# Patient Record
Sex: Female | Born: 1985 | Race: White | Hispanic: Yes | Marital: Married | Smoking: Never smoker
Health system: Southern US, Community
[De-identification: ages and names within clinical notes are randomized; demographics above are authoritative.]

## PROBLEM LIST (undated history)

## (undated) DIAGNOSIS — R768 Other specified abnormal immunological findings in serum: Secondary | ICD-10-CM

## (undated) DIAGNOSIS — Z789 Other specified health status: Secondary | ICD-10-CM

## (undated) DIAGNOSIS — H5213 Myopia, bilateral: Secondary | ICD-10-CM

## (undated) DIAGNOSIS — H52209 Unspecified astigmatism, unspecified eye: Secondary | ICD-10-CM

## (undated) HISTORY — DX: Unspecified astigmatism, unspecified eye: H52.209

## (undated) HISTORY — DX: Myopia, bilateral: H52.13

---

## 2008-04-15 ENCOUNTER — Inpatient Hospital Stay (HOSPITAL_COMMUNITY): Admission: AD | Admit: 2008-04-15 | Discharge: 2008-04-15 | Payer: Self-pay | Admitting: Obstetrics

## 2008-04-16 ENCOUNTER — Inpatient Hospital Stay (HOSPITAL_COMMUNITY): Admission: AD | Admit: 2008-04-16 | Discharge: 2008-04-19 | Payer: Self-pay | Admitting: Obstetrics

## 2009-10-07 ENCOUNTER — Ambulatory Visit: Payer: Self-pay | Admitting: Family Medicine

## 2010-01-15 ENCOUNTER — Emergency Department (HOSPITAL_COMMUNITY): Admission: EM | Admit: 2010-01-15 | Discharge: 2010-01-15 | Payer: Self-pay | Admitting: Family Medicine

## 2010-05-02 ENCOUNTER — Emergency Department (HOSPITAL_COMMUNITY)
Admission: EM | Admit: 2010-05-02 | Discharge: 2010-05-02 | Payer: Self-pay | Source: Home / Self Care | Admitting: Family Medicine

## 2010-06-11 NOTE — L&D Delivery Note (Signed)
Operative Delivery Note  Ms Whitney [name] was prepared for a Vacuum-assisted delivery.  The indication for the procedure is maternal exhaustion  EFW 3300  grams. The cervix was completely dilated and effaced.  The bladder was emptied.  At  a viable female was delivered via .  Presentation: vertex; Position: Right,, Occiput,, Anterior; Station: +3.  Verbal consent: obtained from patient.  A time-out was performed.  Type of vacuum: M/posterior cup Total time of vacuum application 4 minutes 1st application to delivery Maximum vacuum achieved 50 (cm Hg) Number of contractions used through 2 Number of "pop offs" 0 Vacuum reduced between contractions.  There was advancement in station with each pull.  Extraction successful.  The vacuum application site was flexing median.  The flexion point was identified.  The cup choice was appropriate.  Maternal tissue was excluded from the vacuum cup.     Placenta status: delivered w/cord traction, intact 3VC    Anesthesia:  Epidural Episiotomy: None Lacerations: Anal verge/3rd degree Suture Repair: 2.0 3.0 vicryl rapide Est. Blood Loss (mL): 300  Mom to postpartum.  Infant  stable.  [name],Whitney Arias A 02/06/2011, 5:59 AM

## 2010-08-22 LAB — POCT URINALYSIS DIPSTICK
Ketones, ur: NEGATIVE mg/dL
Protein, ur: NEGATIVE mg/dL
Specific Gravity, Urine: 1.01 (ref 1.005–1.030)
pH: 6.5 (ref 5.0–8.0)

## 2010-08-22 LAB — URINE CULTURE: Colony Count: >=100000

## 2010-08-22 LAB — HCG, SERUM, QUALITATIVE: Preg, Serum: NEGATIVE

## 2010-10-24 NOTE — H&P (Signed)
NAMEVARETTA, CHAVERS NO.:  [id number]   MEDICAL RECORD NO.:  [id number]          PATIENT TYPE:  INP   LOCATION:  9103                          FACILITY:  [facility]   PHYSICIAN:  Kathreen Cosier, M.D.DATE OF BIRTH:  09-02-1985   DATE OF ADMISSION:  04/16/2008  DATE OF DISCHARGE:                              HISTORY & PHYSICAL   The patient is a 25 year old gravida 1 whose EDC was April 15, 2008.  She was admitted with ruptured membranes, which occurred at 4:00 a.m.  today, meconium-stained fluid and she was in labor and 5 cm dilated,  100% vertex, -2 at 7:45 a.m.  IUPC was inserted at that time and the  patient progressed to full dilatation by 12:25 p.m. and then she was  allowed to labor down and started pushing at 2 o'clock.  By 4:10 p.m.,  she had pushed over 2 hours and there was no descent beyond 0 station  and it decided she would be delivered by C-section for failure descent.   PHYSICAL EXAMINATION:  GENERAL:  A well-developed female in labor.  HEENT: Negative.  LUNGS: Clear.  HEART:  Regular rhythm.  No murmurs and no gallops.  BREASTS:  No masses.  ABDOMEN:  Term size uterus with estimated fetal weight 7 pounds 6  ounces.  EXTREMITIES:  Negative.           ______________________________  Kathreen Cosier, M.D.     BAM/MEDQ  D:  04/16/2008  T:  04/17/2008  Job:  [id number]

## 2010-10-24 NOTE — Op Note (Signed)
Whitney [name], MCBREARTY NO.:  [id number]   MEDICAL RECORD NO.:  [id number]          PATIENT TYPE:  INP   LOCATION:  9103                          FACILITY:  [facility]   PHYSICIAN:  Kathreen Cosier, M.D.DATE OF BIRTH:  1985-11-25   DATE OF PROCEDURE:  04/16/2008  DATE OF DISCHARGE:                               OPERATIVE REPORT   PREOPERATIVE DIAGNOSIS:  Failure to descend fetal macrosomia.   POSTOPERATIVE DIAGNOSIS:  Failure to descend fetal macrosomia.   ANESTHESIA:  Epidural.   PROCEDURE:  The patient placed on the operating table in a supine  position.  Abdomen prepped and draped.  Bladder emptied with a Foley  catheter.  Transverse suprapubic incision made and carried down through  rectus fascia.  Fascia cleaned and incised the length of the incision.  The recti muscles were retracted laterally.  Peritoneum incised  longitudinally.  Transverse incision made in the visceral peritoneum  above the bladder and the bladder mobilized inferiorly.  Transverse  lower uterine incision made.  The fluid was thin meconium and the baby  was delivered from the OP position of a female Apgar 9 and 9, weighing 8  pounds 4 ounces.  The team was in attendance.  The placenta was anterior  to fundus removed manually and sent to labor and delivery.  Uterine  cavity cleaned with dry laps.  Uterine incision closed in one layer with  continuous suture of #1 chromic.  Hemostasis satisfactory.  Bladder flap  reattached with 2-0 chromic.  Uterus well contracted.  Tubes and ovaries  normal.  Abdomen closed in layers, peritoneum continuous suture of 0  chromic, fascia continuous suture of 0 Dexon, skin closed with  subcuticular stitch of 4-0 Monocryl.           ______________________________  Kathreen Cosier, M.D.     BAM/MEDQ  D:  04/16/2008  T:  04/17/2008  Job:  [id number]

## 2010-10-27 NOTE — Discharge Summary (Signed)
NAMEVERNITA, TAGUE NO.:  [id number]   MEDICAL RECORD NO.:  [id number]          PATIENT TYPE:  INP   LOCATION:  9103                          [facility]ACILITY:  [facility]   PHYSICIAN:  Kathreen Cosier, M.D.DATE OF BIRTH:  1986/06/07   DATE OF ADMISSION:  04/16/2008  DATE OF DISCHARGE:  04/19/2008                               DISCHARGE SUMMARY   The patient is a 25 year old primigravida, [facility] April 15, 2008, who was  admitted with ruptured membranes, meconium-stained fluid in labor.  Cervix 5 cm, 100%, vertex -2 station.  The patient eventually underwent  a primary low-transverse cesarean section because of failure of the  fetus to descend.  She was fully dilated and pushed for more than 2  hours with no descent of the vertex beyond 0 station.  She underwent a  primary low-transverse cesarean section, delivered a 8 pounds 4 ounces  female from the OP position.  Postoperatively, she did well.  Her  hemoglobin was 7.7.  She was discharged home on the third postoperative  day ambulatory on a regular diet on Tylox for pain, ampicillin 500 p.o.  q.6 h. for 5 days, and ferrous sulfate for her anemia.   DISCHARGE DIAGNOSIS:  Status post primary low-transverse cesarean  section at term for CPD.           ______________________________  Kathreen Cosier, M.D.     BAM/MEDQ  D:  05/19/2008  T:  05/19/2008  Job:  [id number]

## 2010-11-06 ENCOUNTER — Inpatient Hospital Stay (INDEPENDENT_AMBULATORY_CARE_PROVIDER_SITE_OTHER)
Admission: RE | Admit: 2010-11-06 | Discharge: 2010-11-06 | Disposition: A | Discharge status: Home / Self Care | Payer: Self-pay | Source: Ambulatory Visit | Attending: Emergency Medicine | Admitting: Emergency Medicine

## 2010-11-06 DIAGNOSIS — S0010XA Contusion of unspecified eyelid and periocular area, initial encounter: Secondary | ICD-10-CM

## 2011-01-02 LAB — RPR: RPR: NONREACTIVE

## 2011-01-02 LAB — HEPATITIS B SURFACE ANTIGEN: Hepatitis B Surface Ag: NEGATIVE

## 2011-02-05 ENCOUNTER — Inpatient Hospital Stay (HOSPITAL_COMMUNITY): Payer: Medicaid Other | Admitting: Anesthesiology

## 2011-02-05 ENCOUNTER — Encounter (HOSPITAL_COMMUNITY): Payer: Self-pay

## 2011-02-05 ENCOUNTER — Inpatient Hospital Stay (HOSPITAL_COMMUNITY)
Admission: AD | Admit: 2011-02-05 | Discharge: 2011-02-07 | DRG: 775 | Disposition: A | Discharge status: Home / Self Care | Payer: Medicaid Other | Source: Ambulatory Visit | Attending: Obstetrics | Admitting: Obstetrics

## 2011-02-05 ENCOUNTER — Encounter (HOSPITAL_COMMUNITY): Payer: Self-pay | Admitting: Anesthesiology

## 2011-02-05 DIAGNOSIS — O34219 Maternal care for unspecified type scar from previous cesarean delivery: Secondary | ICD-10-CM | POA: Diagnosis present

## 2011-02-05 HISTORY — DX: Other specified abnormal immunological findings in serum: R76.8

## 2011-02-05 HISTORY — DX: Other specified health status: Z78.9

## 2011-02-05 LAB — CBC
HCT: 34.6 % — ABNORMAL LOW (ref 36.0–46.0)
Hemoglobin: 11.4 g/dL — ABNORMAL LOW (ref 12.0–15.0)
MCHC: 32.9 g/dL (ref 30.0–36.0)
MCV: 83.4 fL (ref 78.0–100.0)
RDW: 15.5 % (ref 11.5–15.5)

## 2011-02-05 LAB — POCT FERN TEST: Fern Test: POSITIVE

## 2011-02-05 MED ORDER — PHENYLEPHRINE 40 MCG/ML (10ML) SYRINGE FOR IV PUSH (FOR BLOOD PRESSURE SUPPORT)
80.0000 ug | PREFILLED_SYRINGE | INTRAVENOUS | Status: DC | PRN
  Filled 2011-02-05: qty 5

## 2011-02-05 MED ORDER — FLEET ENEMA 7-19 GM/118ML RE ENEM: 1.0000 | ENEMA | RECTAL | Status: DC | PRN

## 2011-02-05 MED ORDER — IBUPROFEN 600 MG PO TABS: 600.0000 mg | ORAL_TABLET | Freq: Four times a day (QID) | ORAL | Status: DC | PRN

## 2011-02-05 MED ORDER — PHENYLEPHRINE 40 MCG/ML (10ML) SYRINGE FOR IV PUSH (FOR BLOOD PRESSURE SUPPORT)
80.0000 ug | PREFILLED_SYRINGE | INTRAVENOUS | Status: DC | PRN
  Filled 2011-02-05 (×2): qty 5

## 2011-02-05 MED ORDER — LACTATED RINGERS IV SOLN
500.0000 mL | Freq: Once | INTRAVENOUS | Status: AC
  Administered 2011-02-05: 500 mL via INTRAVENOUS

## 2011-02-05 MED ORDER — LACTATED RINGERS IV SOLN
INTRAVENOUS | Status: DC
  Administered 2011-02-05 – 2011-02-06 (×2): via INTRAVENOUS

## 2011-02-05 MED ORDER — EPHEDRINE 5 MG/ML INJ
10.0000 mg | INTRAVENOUS | Status: DC | PRN
  Filled 2011-02-05 (×2): qty 4

## 2011-02-05 MED ORDER — OXYTOCIN BOLUS FROM INFUSION
500.0000 mL | Freq: Once | INTRAVENOUS | Status: DC
  Filled 2011-02-05: qty 500

## 2011-02-05 MED ORDER — BUTORPHANOL TARTRATE 2 MG/ML IJ SOLN: 1.0000 mg | INTRAMUSCULAR | Status: DC | PRN

## 2011-02-05 MED ORDER — LACTATED RINGERS IV SOLN: INTRAVENOUS | Status: DC

## 2011-02-05 MED ORDER — LIDOCAINE HCL 1.5 % IJ SOLN
INTRAMUSCULAR | Status: DC | PRN
  Administered 2011-02-05 (×2): 5 mL via EPIDURAL

## 2011-02-05 MED ORDER — OXYCODONE-ACETAMINOPHEN 5-325 MG PO TABS: 2.0000 | ORAL_TABLET | ORAL | Status: DC | PRN

## 2011-02-05 MED ORDER — OXYTOCIN 20 UNITS IN LACTATED RINGERS INFUSION - SIMPLE: 125.0000 mL/h | INTRAVENOUS | Status: AC

## 2011-02-05 MED ORDER — LIDOCAINE HCL (PF) 1 % IJ SOLN: 30.0000 mL | INTRAMUSCULAR | Status: DC | PRN

## 2011-02-05 MED ORDER — ONDANSETRON HCL 4 MG/2ML IJ SOLN: 4.0000 mg | Freq: Four times a day (QID) | INTRAMUSCULAR | Status: DC | PRN

## 2011-02-05 MED ORDER — ACETAMINOPHEN 325 MG PO TABS: 650.0000 mg | ORAL_TABLET | ORAL | Status: DC | PRN

## 2011-02-05 MED ORDER — OXYTOCIN 10 UNIT/ML IJ SOLN
INTRAMUSCULAR | Status: AC
  Filled 2011-02-05: qty 2

## 2011-02-05 MED ORDER — LACTATED RINGERS IV SOLN: 500.0000 mL | INTRAVENOUS | Status: DC | PRN

## 2011-02-05 MED ORDER — FENTANYL 2.5 MCG/ML BUPIVACAINE 1/10 % EPIDURAL INFUSION (WH - ANES)
14.0000 mL/h | INTRAMUSCULAR | Status: DC
  Administered 2011-02-05 – 2011-02-06 (×3): 14 mL/h via EPIDURAL
  Filled 2011-02-05 (×3): qty 60

## 2011-02-05 MED ORDER — PROMETHAZINE HCL 25 MG/ML IJ SOLN: 12.5000 mg | INTRAMUSCULAR | Status: DC | PRN

## 2011-02-05 MED ORDER — LACTATED RINGERS IV SOLN
500.0000 mL | INTRAVENOUS | Status: DC | PRN
  Administered 2011-02-05: 750 mL via INTRAVENOUS

## 2011-02-05 MED ORDER — EPHEDRINE 5 MG/ML INJ
10.0000 mg | INTRAVENOUS | Status: DC | PRN
  Filled 2011-02-05: qty 4

## 2011-02-05 MED ORDER — OXYTOCIN 20 UNITS IN LACTATED RINGERS INFUSION - SIMPLE
1.0000 m[IU]/min | INTRAVENOUS | Status: DC
  Administered 2011-02-05: 2 m[IU]/min via INTRAVENOUS
  Filled 2011-02-05: qty 1000

## 2011-02-05 MED ORDER — DIPHENHYDRAMINE HCL 50 MG/ML IJ SOLN: 12.5000 mg | INTRAMUSCULAR | Status: DC | PRN

## 2011-02-05 MED ORDER — CITRIC ACID-SODIUM CITRATE 334-500 MG/5ML PO SOLN: 30.0000 mL | ORAL | Status: DC | PRN

## 2011-02-05 MED ORDER — TERBUTALINE SULFATE 1 MG/ML IJ SOLN: 0.2500 mg | Freq: Once | INTRAMUSCULAR | Status: AC | PRN

## 2011-02-05 MED ORDER — LIDOCAINE HCL (PF) 1 % IJ SOLN
30.0000 mL | INTRAMUSCULAR | Status: DC | PRN
  Administered 2011-02-06: 30 mL via SUBCUTANEOUS
  Filled 2011-02-05: qty 30

## 2011-02-05 MED ORDER — OXYTOCIN 20 UNITS IN LACTATED RINGERS INFUSION - SIMPLE: 125.0000 mL/h | INTRAVENOUS | Status: DC

## 2011-02-05 MED ORDER — ZOLPIDEM TARTRATE 10 MG PO TABS: 10.0000 mg | ORAL_TABLET | Freq: Every evening | ORAL | Status: DC | PRN

## 2011-02-05 NOTE — Anesthesia Preprocedure Evaluation (Signed)
Anesthesia Evaluation  Name, MR# and DOB Patient awake  General Assessment Comment  Reviewed: Allergy & Precautions, H&P , Patient's Chart, lab work & pertinent test results  Airway Mallampati: II TM Distance: >3 FB Neck ROM: full    Dental No notable dental hx.    Pulmonary  clear to auscultation  pulmonary exam normalPulmonary Exam Normal breath sounds clear to auscultation none    Cardiovascular regular Normal    Neuro/Psych Negative Neurological ROS  Negative Psych ROS  GI/Hepatic/Renal negative GI ROS  negative Liver ROS  negative Renal ROS        Endo/Other  Negative Endocrine ROS (+)      Abdominal   Musculoskeletal   Hematology negative hematology ROS (+)   Peds  Reproductive/Obstetrics (+) Pregnancy    Anesthesia Other Findings             Anesthesia Physical Anesthesia Plan  ASA: II  Anesthesia Plan: Epidural   Post-op Pain Management:    Induction:   Airway Management Planned:   Additional Equipment:   Intra-op Plan:   Post-operative Plan:   Informed Consent: I have reviewed the patients History and Physical, chart, labs and discussed the procedure including the risks, benefits and alternatives for the proposed anesthesia with the patient or authorized representative who has indicated his/her understanding and acceptance.     Plan Discussed with:   Anesthesia Plan Comments:         Anesthesia Quick Evaluation  

## 2011-02-05 NOTE — Progress Notes (Signed)
Whitney [name] is a 25 y.o. G2P1001 at [redacted]w[redacted]d by LMP admitted for early labor  Subjective: C/O UCs  Objective: BP 111/64  Pulse 71  Temp(Src) 97.9 F (36.6 C) (Oral)  Resp 20  Ht 5\' 4"  (1.626 m)  Wt 78.019 kg (172 lb)  BMI 29.52 kg/m2  SpO2 97%      FHT:  FHR: 140 bpm, variability: moderate,  accelerations:  Present,  decelerations:  Absent UC:   irregular, every 5 minutes SVE:   Dilation: 4 Effacement (%): 80 Station: -2 Exam by:: Dr. Gaynell Face  Labs: Lab Results  Component Value Date   WBC 12.0* 02/05/2011   HGB 11.4* 02/05/2011   HCT 34.6* 02/05/2011   MCV 83.4 02/05/2011   PLT 222 02/05/2011    Assessment / Plan: Protracted latent phase.  Desires VBAC  Labor: Augment labor w/low dose Pitoce  Fetal Wellbeing:  Category I Pain Control:  Epidural planned. I/D:  n/a Anticipated MOD:  NSVD  JACKSON-MOORE,Sheril Hammond A 02/05/2011, 8:23 PM

## 2011-02-05 NOTE — Progress Notes (Signed)
Pt states she had a little leaking last night and again at 1150 had more leaking clear fluid. Not actively leaking. Pt had a previous ceasrean section but is for a trial of labor. Reports good fetal movement.

## 2011-02-05 NOTE — Anesthesia Procedure Notes (Signed)

## 2011-02-05 NOTE — H&P (Signed)
This is Dr. Francoise Ceo dictating the history and physical on  Whitney [name] He is a 25 year old gravida 2 para 101 EDC 02/08/2011 is O- GBS and HIV Membranes ruptured spontaneously at noon today she is contracting every 4 minutes Cervix 4 cm 85% vertex -2 station she is also VBAC Past medical history negative Past surgical history she had a C-section Family history negative Social history negative System review negative Physical exam HEENT negative Lungs clear Breasts negative Heart regular rhythm no murmurs no gallops Abdomen term Pelvic as described above Extremities negative and

## 2011-02-06 ENCOUNTER — Encounter (HOSPITAL_COMMUNITY): Payer: Self-pay

## 2011-02-06 MED ORDER — BENZOCAINE-MENTHOL 20-0.5 % EX AERO
1.0000 "application " | INHALATION_SPRAY | CUTANEOUS | Status: DC | PRN
  Administered 2011-02-06: 1 via TOPICAL

## 2011-02-06 MED ORDER — TETANUS-DIPHTH-ACELL PERTUSSIS 5-2.5-18.5 LF-MCG/0.5 IM SUSP: 0.5000 mL | Freq: Once | INTRAMUSCULAR | Status: DC

## 2011-02-06 MED ORDER — ONDANSETRON HCL 4 MG PO TABS: 4.0000 mg | ORAL_TABLET | ORAL | Status: DC | PRN

## 2011-02-06 MED ORDER — ZOLPIDEM TARTRATE 5 MG PO TABS: 5.0000 mg | ORAL_TABLET | Freq: Every evening | ORAL | Status: DC | PRN

## 2011-02-06 MED ORDER — PRENATAL PLUS 27-1 MG PO TABS
1.0000 | ORAL_TABLET | Freq: Every day | ORAL | Status: DC
  Administered 2011-02-07: 1 via ORAL
  Filled 2011-02-06: qty 1

## 2011-02-06 MED ORDER — BENZOCAINE-MENTHOL 20-0.5 % EX AERO
INHALATION_SPRAY | CUTANEOUS | Status: AC
  Administered 2011-02-06: 1 via TOPICAL
  Filled 2011-02-06: qty 56

## 2011-02-06 MED ORDER — MAGNESIUM HYDROXIDE 400 MG/5ML PO SUSP: 30.0000 mL | ORAL | Status: DC | PRN

## 2011-02-06 MED ORDER — WITCH HAZEL-GLYCERIN EX PADS: 1.0000 "application " | MEDICATED_PAD | CUTANEOUS | Status: DC | PRN

## 2011-02-06 MED ORDER — LANOLIN HYDROUS EX OINT: TOPICAL_OINTMENT | CUTANEOUS | Status: DC | PRN

## 2011-02-06 MED ORDER — ONDANSETRON HCL 4 MG/2ML IJ SOLN: 4.0000 mg | INTRAMUSCULAR | Status: DC | PRN

## 2011-02-06 MED ORDER — OXYCODONE-ACETAMINOPHEN 5-325 MG PO TABS
1.0000 | ORAL_TABLET | ORAL | Status: DC | PRN
  Administered 2011-02-06 – 2011-02-07 (×3): 1 via ORAL
  Filled 2011-02-06 (×3): qty 1

## 2011-02-06 MED ORDER — FERROUS SULFATE 325 (65 FE) MG PO TABS
325.0000 mg | ORAL_TABLET | Freq: Two times a day (BID) | ORAL | Status: DC
  Administered 2011-02-06 – 2011-02-07 (×3): 325 mg via ORAL
  Filled 2011-02-06 (×3): qty 1

## 2011-02-06 MED ORDER — DIPHENHYDRAMINE HCL 25 MG PO CAPS: 25.0000 mg | ORAL_CAPSULE | Freq: Four times a day (QID) | ORAL | Status: DC | PRN

## 2011-02-06 MED ORDER — MEASLES, MUMPS & RUBELLA VAC ~~LOC~~ INJ
0.5000 mL | INJECTION | Freq: Once | SUBCUTANEOUS | Status: DC
  Filled 2011-02-06: qty 0.5

## 2011-02-06 MED ORDER — DIBUCAINE 1 % RE OINT: 1.0000 "application " | TOPICAL_OINTMENT | RECTAL | Status: DC | PRN

## 2011-02-06 MED ORDER — IBUPROFEN 600 MG PO TABS
600.0000 mg | ORAL_TABLET | Freq: Four times a day (QID) | ORAL | Status: DC
  Administered 2011-02-06 – 2011-02-07 (×5): 600 mg via ORAL
  Filled 2011-02-06 (×5): qty 1

## 2011-02-06 MED ORDER — MEDROXYPROGESTERONE ACETATE 150 MG/ML IM SUSP
150.0000 mg | INTRAMUSCULAR | Status: AC | PRN
  Administered 2011-02-07: 150 mg via INTRAMUSCULAR
  Filled 2011-02-06: qty 1

## 2011-02-06 MED ORDER — SENNOSIDES-DOCUSATE SODIUM 8.6-50 MG PO TABS
2.0000 | ORAL_TABLET | Freq: Every day | ORAL | Status: DC
  Administered 2011-02-06: 2 via ORAL

## 2011-02-06 NOTE — Progress Notes (Signed)
Pt assymptomatic with b/p. Denies weakness, dizziness or nausea, Pt alert and ox4

## 2011-02-06 NOTE — Anesthesia Postprocedure Evaluation (Signed)
  Anesthesia Post-op Note  Patient: Whitney [name]  Procedure(s) Performed: * No procedures listed *  Patient Location:122  Anesthesia Type: Epidural  Level of Consciousness: awake, alert  and oriented  Airway and Oxygen Therapy: Patient Spontanous Breathing  Post-op Pain: mild  Post-op Assessment: Post-op Vital signs reviewed, Patient's Cardiovascular Status Stable, No headache, No backache, No residual numbness and No residual motor weakness  Post-op Vital Signs: Reviewed and stable  Complications: No apparent anesthesia complications

## 2011-02-07 LAB — CBC
HCT: 29 % — ABNORMAL LOW (ref 36.0–46.0)
Hemoglobin: 9.6 g/dL — ABNORMAL LOW (ref 12.0–15.0)
RDW: 15.6 % — ABNORMAL HIGH (ref 11.5–15.5)
WBC: 10.8 10*3/uL — ABNORMAL HIGH (ref 4.0–10.5)

## 2011-02-07 MED ORDER — BENZOCAINE-MENTHOL 20-0.5 % EX AERO: 1.0000 "application " | INHALATION_SPRAY | CUTANEOUS | Status: DC | PRN

## 2011-02-07 NOTE — Discharge Summary (Signed)
Obstetric Discharge Summary Reason for Admission: onset of labor Prenatal Procedures: none Intrapartum Procedures: spontaneous vaginal delivery Postpartum Procedures: none Complications-Operative and Postpartum: none Hemoglobin  Date Value Range Status  02/07/2011 9.6* 12.0-15.0 (g/dL) Final     HCT  Date Value Range Status  02/07/2011 29.0* 36.0-46.0 (%) Final    Discharge Diagnoses: Term Pregnancy-delivered  Discharge Information: Date: 02/07/2011 Activity: pelvic rest Diet: routine Medications: None Condition: stable Instructions: refer to practice specific booklet Discharge to: home Follow-up Information    Follow up with Corey Laski A, MD. Call in 6 weeks.   Contact information:   [address] [place] [address] [phone]          Newborn Data: Live born female  Birth Weight: 7 lb 5 oz (3317 g) APGAR: 9, 10  Home with mother.  Ladye Macnaughton A 02/07/2011, 7:00 AM

## 2011-02-07 NOTE — Progress Notes (Signed)
UR chart review completed.  

## 2011-02-07 NOTE — Progress Notes (Signed)
  Postpartum day 2 Vital signs normal Fundus firm Lochia moderate Legs negative Discharge today no complaints

## 2011-03-13 LAB — CBC
HCT: 23 — ABNORMAL LOW
MCHC: 32.4
MCV: 86.2
Platelets: 181
RDW: 13.9
RDW: 14
WBC: 11.6 — ABNORMAL HIGH

## 2011-03-13 LAB — URINALYSIS, ROUTINE W REFLEX MICROSCOPIC
Nitrite: NEGATIVE
Protein, ur: NEGATIVE
Specific Gravity, Urine: 1.01
Urobilinogen, UA: 0.2

## 2011-03-13 LAB — URINE MICROSCOPIC-ADD ON

## 2011-03-13 LAB — RPR: RPR Ser Ql: NONREACTIVE

## 2014-04-12 ENCOUNTER — Encounter (HOSPITAL_COMMUNITY): Payer: Self-pay

## 2015-03-07 ENCOUNTER — Encounter (HOSPITAL_COMMUNITY): Payer: Self-pay | Admitting: Emergency Medicine

## 2015-03-07 ENCOUNTER — Emergency Department (HOSPITAL_COMMUNITY)
Admission: EM | Admit: 2015-03-07 | Discharge: 2015-03-07 | Disposition: A | Discharge status: Home / Self Care | Payer: No Typology Code available for payment source | Source: Home / Self Care

## 2015-03-07 DIAGNOSIS — J324 Chronic pansinusitis: Secondary | ICD-10-CM

## 2015-03-07 MED ORDER — IPRATROPIUM BROMIDE 0.06 % NA SOLN: 2.0000 | Freq: Four times a day (QID) | NASAL | Status: DC

## 2015-03-07 MED ORDER — FLUTICASONE PROPIONATE 50 MCG/ACT NA SUSP: 2.0000 | Freq: Every day | NASAL | Status: AC

## 2015-03-07 MED ORDER — FLUCONAZOLE 150 MG PO TABS: 150.0000 mg | ORAL_TABLET | Freq: Every day | ORAL | Status: DC

## 2015-03-07 MED ORDER — AMOXICILLIN-POT CLAVULANATE 875-125 MG PO TABS: 1.0000 | ORAL_TABLET | Freq: Two times a day (BID) | ORAL | Status: DC

## 2015-03-07 NOTE — ED Provider Notes (Signed)
CSN: [id number]     Arrival date & time 03/07/15  1317 History   None    Chief Complaint  Patient presents with  . Nasal Congestion  . Headache  . Cough   (Consider location/radiation/quality/duration/timing/severity/associated sxs/prior Treatment) HPI  Nasal congestion, cough, headache. Symptoms started approximately 3 weeks ago with runny nose. Symptoms are initially intermittent but now constant. Getting worse. Now with facial pain at her cheeks and above her eyes. Cough is fairly intermittent and productive only with the occasional white phlegm. Over-the-counter cold medicines without improvement. Motrin 600 mg improvement. Denies any shortness breath, chest pain, nausea, vomiting, rash, LOC, to admission. Associated with mild headache.  Past Medical History  Diagnosis Date  . Hepatitis B antibody positive   . No pertinent past medical history    Past Surgical History  Procedure Laterality Date  . Cesarean section    . No past surgeries     Family History  Problem Relation Age of Onset  . Diabetes Maternal Grandmother   . Diabetes Maternal Grandfather   . Hypertension Paternal Grandmother   . Hypertension Paternal Grandfather    Social History  Substance Use Topics  . Smoking status: Never Smoker   . Smokeless tobacco: None  . Alcohol Use: No   OB History    Gravida Para Term Preterm AB TAB SAB Ectopic Multiple Living   Review of Systems Per HPI with all other pertinent systems negative.   Allergies  Review of patient's allergies indicates no known allergies.  Home Medications   Prior to Admission medications   Medication Sig Start Date End Date Taking? Authorizing Provider  CHLORPHENIRAMINE MALEATE ER PO Take by mouth.   Yes Historical Provider, MD  naproxen (NAPROSYN) 250 MG tablet Take by mouth 2 (two) times daily with a meal.   Yes Historical Provider, MD  amoxicillin-clavulanate (AUGMENTIN) 875-125 MG per tablet Take 1 tablet by mouth 2  (two) times daily. 03/07/15   Ozella Rocks, MD  benzocaine-Menthol (DERMOPLAST) 20-0.5 % AERO Apply 1 application topically as needed (perineal discomfort). 02/07/11   Kathreen Cosier, MD  fluconazole (DIFLUCAN) 150 MG tablet Take 1 tablet (150 mg total) by mouth daily. Repeat dose in 3 days 03/07/15   Ozella Rocks, MD  fluticasone [facility]) 50 MCG/ACT nasal spray Place 2 sprays into both nostrils at bedtime. 03/07/15   Ozella Rocks, MD  ipratropium (ATROVENT) 0.06 % nasal spray Place 2 sprays into both nostrils 4 (four) times daily. 03/07/15   Ozella Rocks, MD   Meds Ordered and Administered this Visit  Medications - No data to display  BP 94/59 mmHg  Pulse 88  Temp(Src) 98.4 F (36.9 C) (Oral)  Resp 16  SpO2 100%  LMP  (LMP Unknown) No data found.   Physical Exam Physical Exam  Constitutional: oriented to person, place, and time. appears well-developed and well-nourished. No distress.  HENT:  Head: Normocephalic and atraumatic.  Maxillary and frontal sinuses tender to palpation, TMs bilateral normal. Tonsils 0 without exudate, mild pharyngeal cobblestoning. Eyes: EOMI. PERRL.  Neck: Normal range of motion.  Cardiovascular: RRR, no m/r/g, 2+ distal pulses,  Pulmonary/Chest: Effort normal and breath sounds normal. No respiratory distress.  Abdominal: Soft. Bowel sounds are normal. NonTTP, no distension.  Musculoskeletal: Normal range of motion. Non ttp, no effusion.  Neurological: alert and oriented to person, place, and time.  Skin: Skin is warm. No rash  noted. non diaphoretic.  Psychiatric: normal mood and affect. behavior is normal. Judgment and thought content normal.   ED Course  Procedures (including critical care time)  Labs Review Labs Reviewed - No data to display  Imaging Review No results found.   Visual Acuity Review  Right Eye Distance:   Left Eye Distance:   Bilateral Distance:    Right Eye Near:   Left Eye Near:    Bilateral Near:          MDM   1. Pansinusitis, unspecified chronicity    Nasal saline, nasal Atrovent, start Augmentin, Diflucan if felt to sit infection, continue NSAIDs, Flonase as patient also with some baseline allergic rhinitis.    Ozella Rocks, MD 03/07/15 [phone]

## 2015-03-07 NOTE — ED Notes (Signed)
Pt states she has been suffering from nasal congestion, cough, and headache for 3 weeks.  Pt states her chest hurts when she coughs and her head is a 9/10, but will go down to a 6/10 with Naproxen.  She is also taking sinus medication, with very little relief.

## 2015-03-07 NOTE — Discharge Instructions (Signed)
You have developed a sinus infection. This will require antibiotic clear. Please use nasal saline to help clear out her nasal passage of all the mucus. Please use nasal Atrovent to dry up your nasal congestion and help you breathe better and stop your runny nose. Please use the Flonase for nightly allergy control as well as an nightly allergy medicine such as Zyrtec or Allegra. Please continue the ibuprofen or Naprosyn but not both. Please use the Diflucan if you develop a yeast infection.   Usted ha desarrollado una infeccin sinusal. Esto requerir antibiticos clara. Por favor, use solucin salina nasal para ayudar a limpiar su pasaje nasal de toda la mucosidad. Por favor, use Atrovent nasal para secar la congestin nasal y Hotel manager a Solicitor y TEFL teacher el goteo de la Clinical cytogeneticist. Utilice el control de la alergia Flonase para todas las noches, as como una medicina para la alergia noche como Zyrtec o Careers adviser. Por favor contine el ibuprofeno o el naproxeno, pero no ambos. Utilice el Diflucan si desarrolla una infeccin por levaduras.

## 2015-06-22 ENCOUNTER — Emergency Department (INDEPENDENT_AMBULATORY_CARE_PROVIDER_SITE_OTHER)
Admission: EM | Admit: 2015-06-22 | Discharge: 2015-06-22 | Disposition: A | Discharge status: Home / Self Care | Payer: No Typology Code available for payment source | Source: Home / Self Care | Attending: Emergency Medicine | Admitting: Emergency Medicine

## 2015-06-22 ENCOUNTER — Encounter (HOSPITAL_COMMUNITY): Payer: Self-pay | Admitting: Emergency Medicine

## 2015-06-22 ENCOUNTER — Other Ambulatory Visit (HOSPITAL_COMMUNITY)
Admission: RE | Admit: 2015-06-22 | Discharge: 2015-06-22 | Disposition: A | Discharge status: Home / Self Care | Payer: No Typology Code available for payment source | Source: Ambulatory Visit | Attending: Emergency Medicine | Admitting: Emergency Medicine

## 2015-06-22 ENCOUNTER — Other Ambulatory Visit (HOSPITAL_COMMUNITY): Admission: AD | Admit: 2015-06-22 | Payer: Self-pay | Source: Ambulatory Visit | Admitting: Emergency Medicine

## 2015-06-22 DIAGNOSIS — N390 Urinary tract infection, site not specified: Secondary | ICD-10-CM

## 2015-06-22 DIAGNOSIS — R519 Headache, unspecified: Secondary | ICD-10-CM

## 2015-06-22 DIAGNOSIS — R51 Headache: Secondary | ICD-10-CM

## 2015-06-22 LAB — POCT URINALYSIS DIP (DEVICE)
BILIRUBIN URINE: NEGATIVE
GLUCOSE, UA: NEGATIVE mg/dL
Ketones, ur: NEGATIVE mg/dL
LEUKOCYTES UA: NEGATIVE
NITRITE: NEGATIVE
Protein, ur: NEGATIVE mg/dL
SPECIFIC GRAVITY, URINE: 1.015 (ref 1.005–1.030)
Urobilinogen, UA: 0.2 mg/dL (ref 0.0–1.0)
pH: 7.5 (ref 5.0–8.0)

## 2015-06-22 LAB — POCT PREGNANCY, URINE: Preg Test, Ur: NEGATIVE

## 2015-06-22 MED ORDER — IBUPROFEN 600 MG PO TABS: 600.0000 mg | ORAL_TABLET | Freq: Four times a day (QID) | ORAL | Status: DC | PRN

## 2015-06-22 MED ORDER — CEPHALEXIN 500 MG PO CAPS: 500.0000 mg | ORAL_CAPSULE | Freq: Four times a day (QID) | ORAL | Status: DC

## 2015-06-22 MED ORDER — TRIAMCINOLONE ACETONIDE 0.1 % EX CREA: 1.0000 "application " | TOPICAL_CREAM | Freq: Two times a day (BID) | CUTANEOUS | Status: DC

## 2015-06-22 NOTE — ED Notes (Signed)
[facility] a complaint of a headache and left lower abdominal pain and right lower back pain that has been occurring x 1 week.

## 2015-06-22 NOTE — ED Provider Notes (Signed)
CSN: [id number]     Arrival date & time 06/22/15  1700 History   First MD Initiated Contact with Patient 06/22/15 1809     Chief Complaint  Patient presents with  . Abdominal Pain  . Back Pain  . Headache   (Consider location/radiation/quality/duration/timing/severity/associated sxs/prior Treatment) HPI  She is a 30 year old woman here for evaluation of headache and abdominal pain. A Spanish phone interpreter was used for this encounter. She states for the past week she has had intermittent headaches and left-sided pelvic pain. The headache is described as frontal and the back of her head.  It responds to Advil, but recurs several hours later. She states the pain in her left pelvis was initially constant, but since she has been drinking a lot of cranberry juice and water it has improved. She does report intermittent pains. She also reports some dysuria. She also reports a brief episode of pain in her right flank, that she describes as a pinch on her kidney. She reports subjective fevers yesterday and today. No nausea or vomiting. No vaginal discharge, but she does describe feeling bloated in the pelvis area.  She also reports an itchy rash on her chest for the last 3 weeks. She denies any new detergents or products.  Past Medical History  Diagnosis Date  . Hepatitis B antibody positive   . No pertinent past medical history    Past Surgical History  Procedure Laterality Date  . Cesarean section    . No past surgeries     Family History  Problem Relation Age of Onset  . Diabetes Maternal Grandmother   . Diabetes Maternal Grandfather   . Hypertension Paternal Grandmother   . Hypertension Paternal Grandfather    Social History  Substance Use Topics  . Smoking status: Never Smoker   . Smokeless tobacco: None  . Alcohol Use: No   OB History    Gravida Para Term Preterm AB TAB SAB Ectopic Multiple Living   2 2 2       2      Review of Systems As in history of present  illness Allergies  Review of patient's allergies indicates no known allergies.  Home Medications   Prior to Admission medications   Medication Sig Start Date End Date Taking? Authorizing Provider  cephALEXin (KEFLEX) 500 MG capsule Take 1 capsule (500 mg total) by mouth 4 (four) times daily. 06/22/15   Charm RingsErin J Javanna Patin, MD  CHLORPHENIRAMINE MALEATE ER PO Take by mouth.    Historical Provider, MD  fluticasone (FLONASE) 50 MCG/ACT nasal spray Place 2 sprays into both nostrils at bedtime. 03/07/15   Ozella Rocksavid J Merrell, MD  ibuprofen (ADVIL,MOTRIN) 600 MG tablet Take 1 tablet (600 mg total) by mouth every 6 (six) hours as needed for moderate pain. 06/22/15   Charm RingsErin J Dugan Vanhoesen, MD  ipratropium (ATROVENT) 0.06 % nasal spray Place 2 sprays into both nostrils 4 (four) times daily. 03/07/15   Ozella Rocksavid J Merrell, MD  naproxen (NAPROSYN) 250 MG tablet Take by mouth 2 (two) times daily with a meal.    Historical Provider, MD  triamcinolone cream (KENALOG) 0.1 % Apply 1 application topically 2 (two) times daily. 06/22/15   Charm RingsErin J Mohab Ashby, MD   Meds Ordered and Administered this Visit  Medications - No data to display  BP 115/75 mmHg  Pulse 77  Temp(Src) 98.4 F (36.9 C) (Oral)  Resp 18  SpO2 100%  LMP 05/31/2015 (Exact Date) No data found.   Physical Exam  Constitutional: She  is oriented to person, place, and time. She appears well-developed and well-nourished. No distress.  Cardiovascular: Normal rate, regular rhythm and normal heart sounds.   No murmur heard. Pulmonary/Chest: Effort normal and breath sounds normal. No respiratory distress. She has no wheezes. She has no rales.  Abdominal: Soft. Bowel sounds are normal. She exhibits no distension and no mass. There is tenderness (left lower quadrant and suprapubic). There is no rebound and no guarding.  No CVA tenderness  Neurological: She is alert and oriented to person, place, and time.  Skin: Rash (multiple small patches of eczematous rash on chest.) noted.     ED Course  Procedures (including critical care time)  Labs Review Labs Reviewed  POCT URINALYSIS DIP (DEVICE) - Abnormal; Notable for the following:    Hgb urine dipstick SMALL (*)    All other components within normal limits  URINE CULTURE  POCT PREGNANCY, URINE    Imaging Review No results found.    MDM   1. UTI (lower urinary tract infection)   2. Headache, unspecified headache type    Triamcinolone cream for the rash. History is concerning for UTI versus ovarian cyst. We'll treat with Keflex and send urine for culture. Ibuprofen as needed for headache and pelvic pain. Return precautions reviewed. Follow-up as needed.    Charm Rings, MD 06/22/15 [phone]

## 2015-06-22 NOTE — Discharge Instructions (Signed)
You likely have a urinary tract infection. Take Keflex 4 times a day for 1 week. Take ibuprofen 600 mg every 6 hours as needed for pain. Use the triamcinolone cream twice a day for the next week on your rash. If your pain gets worse or yeast or vomiting, please go to the emergency room. If things are not improving in the next week, please come back.  Es probable que tenga una infeccin del tracto urinario. Tome Keflex 4 veces al da durante 1 semana. Tome ibuprofeno 600 mg cada 6 horas segn sea necesario para el dolor. Use la crema de triamcinolona dos veces al da durante la siguiente semana en su erupcin cutnea. Si su dolor empeora o levadura o vmitos, por favor vaya a la sala de emergencias. Si las cosas no estn mejorando en la prxima semana, por favor vuelva.

## 2015-06-23 LAB — URINE CULTURE

## 2016-06-25 ENCOUNTER — Ambulatory Visit (INDEPENDENT_AMBULATORY_CARE_PROVIDER_SITE_OTHER): Payer: Self-pay | Admitting: Student

## 2016-06-25 ENCOUNTER — Encounter: Payer: Self-pay | Admitting: Student

## 2016-06-25 DIAGNOSIS — Z98891 History of uterine scar from previous surgery: Secondary | ICD-10-CM | POA: Insufficient documentation

## 2016-06-25 DIAGNOSIS — Z789 Other specified health status: Secondary | ICD-10-CM | POA: Insufficient documentation

## 2016-06-25 DIAGNOSIS — Z23 Encounter for immunization: Secondary | ICD-10-CM

## 2016-06-25 DIAGNOSIS — Z113 Encounter for screening for infections with a predominantly sexual mode of transmission: Secondary | ICD-10-CM

## 2016-06-25 DIAGNOSIS — Z3490 Encounter for supervision of normal pregnancy, unspecified, unspecified trimester: Secondary | ICD-10-CM

## 2016-06-25 DIAGNOSIS — Z3491 Encounter for supervision of normal pregnancy, unspecified, first trimester: Secondary | ICD-10-CM

## 2016-06-25 LAB — POCT URINALYSIS DIP (DEVICE)
BILIRUBIN URINE: NEGATIVE
GLUCOSE, UA: NEGATIVE mg/dL
Ketones, ur: NEGATIVE mg/dL
NITRITE: NEGATIVE
Protein, ur: NEGATIVE mg/dL
Specific Gravity, Urine: 1.02 (ref 1.005–1.030)
Urobilinogen, UA: 0.2 mg/dL (ref 0.0–1.0)
pH: 5 (ref 5.0–8.0)

## 2016-06-25 LAB — GLUCOSE, CAPILLARY: GLUCOSE-CAPILLARY: 79 mg/dL (ref 65–99)

## 2016-06-25 LAB — HEMOGLOBIN A1C
HEMOGLOBIN A1C: 5.4 % (ref <5.7)
Mean Plasma Glucose: 108 mg/dL

## 2016-06-25 NOTE — Progress Notes (Signed)
Here for first visit. Used interpreter Maretta LosBlanca Lindner. C/o cramps at times. Given new patient information.

## 2016-06-25 NOTE — Patient Instructions (Signed)
Primer trimestre de embarazo (First Trimester of Pregnancy) El primer trimestre de embarazo se extiende desde la semana1 hasta el final de la semana12 (mes1 al mes3). Una semana despus de que un espermatozoide fecunda un vulo, este se implantar en la pared uterina. Este embrin comenzar a desarrollarse hasta convertirse en un beb. Sus genes y los de su pareja forman el beb. Los genes del varn determinan si ser un nio o una nia. Entre la semana6 y la8, se forman los ojos y el rostro, y los latidos del corazn pueden verse en la ecografa. Al final de las 12semanas, todos los rganos del beb estn formados. Ahora que est embarazada, querr hacer todo lo que est a su alcance para tener un beb sano. Dos de las cosas ms importantes son tener una buena atencin prenatal y seguir las indicaciones del mdico. La atencin prenatal incluye toda la asistencia mdica que usted recibe antes del nacimiento del beb. Esta ayudar a prevenir, detectar y tratar cualquier problema durante el embarazo y el parto. CAMBIOS EN EL ORGANISMO Su organismo atraviesa por muchos cambios durante el embarazo, y estos varan de una mujer a otra.  Al principio, puede aumentar o bajar algunos kilos.  Puede tener malestar estomacal (nuseas) y vomitar. Si no puede controlar los vmitos, llame al mdico.  Puede cansarse con facilidad.  Es posible que tenga dolores de cabeza que pueden aliviarse con los medicamentos que el mdico le permita tomar.  Puede orinar con mayor frecuencia. El dolor al orinar puede significar que usted tiene una infeccin de la vejiga.  Debido al embarazo, puede tener acidez estomacal.  Puede estar estreida, ya que ciertas hormonas enlentecen los movimientos de los msculos que empujan los desechos a travs de los intestinos.  Pueden aparecer hemorroides o abultarse e hincharse las venas (venas varicosas).  Las mamas pueden empezar a agrandarse y estar sensibles. Los pezones  pueden sobresalir ms, y el tejido que los rodea (areola) tornarse ms oscuro.  Las encas pueden sangrar y estar sensibles al cepillado y al hilo dental.  Pueden aparecer zonas oscuras o manchas (cloasma, mscara del embarazo) en el rostro que probablemente se atenuarn despus del nacimiento del beb.  Los perodos menstruales se interrumpirn.  Tal vez no tenga apetito.  Puede sentir un fuerte deseo de consumir ciertos alimentos.  Puede tener cambios a nivel emocional da a da, por ejemplo, por momentos puede estar emocionada por el embarazo y por otros preocuparse porque algo pueda salir mal con el embarazo o el beb.  Tendr sueos ms vvidos y extraos.  Tal vez haya cambios en el cabello que pueden incluir su engrosamiento, crecimiento rpido y cambios en la textura. A algunas mujeres tambin se les cae el cabello durante o despus del embarazo, o tienen el cabello seco o fino. Lo ms probable es que el cabello se le normalice despus del nacimiento del beb. QU DEBE ESPERAR EN LAS CONSULTAS PRENATALES Durante una visita prenatal de rutina:  La pesarn para asegurarse de que usted y el beb estn creciendo normalmente.  Le controlarn la presin arterial.  Le medirn el abdomen para controlar el desarrollo del beb.  Se escucharn los latidos cardacos a partir de la semana10 o la12 de embarazo, aproximadamente.  Se analizarn los resultados de los estudios solicitados en visitas anteriores. El mdico puede preguntarle:  Cmo se siente.  Si siente los movimientos del beb.  Si ha tenido sntomas anormales, como prdida de lquido, sangrado, dolores de cabeza intensos o clicos abdominales.    Si est consumiendo algn producto que contenga tabaco, como cigarrillos, tabaco de Theatre managermascar y Administrator, Civil Servicecigarrillos electrnicos.  Si tiene Colgate-Palmolivealguna pregunta. Otros estudios que pueden realizarse durante el primer trimestre incluyen lo siguiente:  Anlisis de sangre para determinar el tipo  de sangre y Engineer, manufacturingdetectar la presencia de infecciones previas. Adems, se los usar para controlar si los niveles de hierro son bajos (anemia) y Chief Strategy Officerdeterminar los anticuerpos Rh. En una etapa ms avanzada del Teagueembarazo, se harn anlisis de sangre para saber si tiene diabetes, junto con otros estudios si surgen problemas.  Anlisis de orina para detectar infecciones, diabetes o protenas en la orina.  Una ecografa para confirmar que el beb crece y se desarrolla correctamente.  Una amniocentesis para diagnosticar posibles problemas genticos.  Estudios del feto para descartar espina bfida y sndrome de Down.  Es posible que necesite otras pruebas adicionales.  Prueba del VIH (virus de inmunodeficiencia humana). Los exmenes prenatales de rutina incluyen la prueba de deteccin del VIH, a menos que decida no Futures traderrealizrsela. INSTRUCCIONES PARA EL CUIDADO EN EL HOGAR Medicamentos:   Siga las indicaciones del mdico en relacin con el uso de medicamentos. Durante el embarazo, hay medicamentos que pueden tomarse y otros que no.  Tome las vitaminas prenatales como se le indic.  Si est estreida, tome un laxante suave, si el mdico lo Lib[place] Arab Jamahiriyaautoriza. Dieta   Consuma alimentos balanceados. Elija alimentos variados, como carne o protenas de origen vegetal, pescado, leche y productos lcteos descremados, verduras, frutas y panes y Radiation protection practitionercereales integrales. El mdico la ayudar a Production assistant, radiodeterminar la cantidad de peso que puede Keyportaumentar.  No coma carne cruda ni quesos sin cocinar. Estos elementos contienen bacterias que pueden causar defectos congnitos en el beb.  La ingesta diaria de cuatro o cinco comidas pequeas en lugar de tres comidas abundantes puede ayudar a Yahooaliviar las nuseas y los vmitos. Si empieza a tener nuseas, comer algunas [address] Avenuegalletas saladas puede ser de Broken Bowayuda. Beber lquidos National Cityentre las comidas en lugar de tomarlos durante las comidas tambin puede ayudar a Optician, dispensingcalmar las nuseas y los vmitos.  Si est  estreida, consuma alimentos con alto contenido de Ben Arnoldfibra, como verduras y frutas frescas, y Radiation protection practitionercereales integrales. Beba suficiente lquido para Photographermantener la orina clara o de color amarillo plido. Actividad y Programme researcher, broadcasting/film/videoejercicios   Haga ejercicio solamente como se lo haya indicado el mdico. El ejercicio la ayudar a:  Art gallery managerControlar el peso.  Mantenerse en forma.  Estar preparada para el trabajo de parto y [place]villeel parto.  Los dolores, los clicos en la parte baja del abdomen o los calambres en la cintura son un buen indicio de que debe dejar de Corporate treasurerhacer ejercicios. Consulte al mdico antes de seguir haciendo ejercicios normales.  Intente no estar de pie FedExdurante mucho tiempo. Mueva las piernas con frecuencia si debe estar de pie en un lugar durante mucho tiempo.  Evite levantar pesos Fortune Brandsexcesivos.  Use zapatos de tacones bajos y Brazilmantenga una buena postura.  Puede seguir teniendo The St. Paul Travelersrelaciones sexuales, excepto que el mdico le indique lo contrario. Alivio del dolor o las molestias   Use un sostn que le brinde buen soporte si siente dolor a la palpacin Mattelen las mamas.  Dese baos de asiento con agua tibia para Engineer, materialsaliviar el dolor o las molestias causadas por las hemorroides. Use crema antihemorroidal si el mdico se lo permite.  Descanse con las piernas elevadas si tiene calambres o dolor de cintura.  Si tiene venas varicosas en las piernas, use medias de descanso. Eleve los pies durante 15minutos,  3 o 4veces por da. Limite la cantidad de sal en su dieta. Cuidados prenatales   Programe las visitas prenatales para la semana12 de Hendersonembarazo. Generalmente se programan cada mes al principio y se hacen ms frecuentes en los 2 ltimos meses antes del parto.  Escriba sus preguntas. Llvelas cuando concurra a las visitas prenatales.  Concurra a todas las visitas prenatales como se lo haya indicado el mdico. Seguridad   Colquese el cinturn de seguridad cuando conduzca.  Haga una lista de los nmeros de telfono de  Associate Professoremergencia, que W. R. Berkleyincluya los nmeros de telfono de familiares, Mahinahinaamigos, el hospital y los departamentos de polica y bomberos. Consejos generales   Pdale al mdico que la derive a clases de educacin prenatal en su localidad. Debe comenzar a tomar las clases antes de Cytogeneticistentrar en el mes6 de embarazo.  Pida ayuda si tiene necesidades nutricionales o de asesoramiento Academic librariandurante el embarazo. El mdico puede aconsejarla o derivarla a especialistas para que la ayuden con diferentes necesidades.  No se d baos de inmersin en agua caliente, baos turcos ni saunas.  No se haga duchas vaginales ni use tampones o toallas higinicas perfumadas.  No mantenga las piernas cruzadas durante South Bethanymucho tiempo.  Evite el contacto con las bandejas sanitarias de los gatos y la tierra que estos animales usan. Estos elementos contienen bacterias que pueden causar defectos congnitos al beb y la posible prdida del feto debido a un aborto espontneo o muerte fetal.  No fume, no consuma hierbas ni medicamentos que no hayan sido recetados por el mdico. Las sustancias qumicas que estos productos contienen afectan la formacin y el desarrollo del beb.  No consuma ningn producto que contenga tabaco, lo que incluye cigarrillos, tabaco de Theatre managermascar y Administrator, Civil Servicecigarrillos electrnicos. Si necesita ayuda para dejar de fumar, consulte al American Expressmdico. Puede recibir asesoramiento y otro tipo de recursos para dejar de fumar.  Programe una cita con el dentista. En su casa, lvese los dientes con un cepillo dental blando y psese el hilo dental con suavidad. SOLICITE ATENCIN MDICA SI:  Tiene mareos.  Siente clicos leves, presin en la pelvis o dolor persistente en el abdomen.  Tiene nuseas, vmitos o diarrea persistentes.  Tiene secrecin vaginal con mal olor.  Siente dolor al ConocoPhillipsorinar.  Tiene el rostro, las Conconullymanos, las piernas o los tobillos ms hinchados. SOLICITE ATENCIN MDICA DE INMEDIATO SI:  Tiene fiebre.  Tiene una prdida de  lquido por la vagina.  Tiene sangrado o pequeas prdidas vaginales.  Siente dolor intenso o clicos en el abdomen.  Sube o baja de peso rpidamente.  Vomita sangre de color rojo brillante o material que parezca granos de caf.  Ha estado expuesta a la rubola y no ha sufrido la enfermedad.  Ha estado expuesta a la quinta enfermedad o a la varicela.  Tiene un dolor de cabeza intenso.  Le falta el aire.  Sufre cualquier tipo de traumatismo, por ejemplo, debido a una cada o un accidente automovilstico. Esta informacin no tiene Theme park managercomo fin reemplazar el consejo del mdico. Asegrese de hacerle al mdico cualquier pregunta que tenga. Document Released: 03/07/2005 Document Revised: 06/18/2014 Document Reviewed: 04/07/2013 Elsevier Interactive Patient Education  2017 Elsevier Inc.      Las medicinas seguras para tomar durante el embarazo  Safe Medications in Pregnancy  Acn:  Benzoyl Peroxide (Perxido de benzolo)  Salicylic Acid (cido saliclico)  Dolor de espalda/Dolor de cabeza:  Tylenol: 2 pastillas de concentracin regular cada 4 horas O 2 pastillas de concentracin fuerte cada 6  horas  Resfriados/Tos/Alergias:  Benadryl (sin alcohol) 25 mg cada 6 horas segn lo necesite Breath Right strips (Tiras para respirar correctamente)  Claritin  Cepacol (pastillas de chupar para la garganta)  Chloraseptic (aerosol para la garganta)  Cold-Eeze- hasta tres veces por da  Cough drops (pastillas de chupar para la tos, sin alcohol)  Flonase (con receta mdica solamente)  Guaifenesin  Mucinex  Robitussin DM (simple solamente, sin alcohol)  Saline nasal spray/drops (Aerosol nasal salino/gotas) Sudafed (pseudoephedrine) y  Actifed * utilizar slo despus de 12 semanas de gestacin y si no tiene la presin arterial alta.  Tylenol Vicks  VapoRub  Zinc lozenges (pastillas para la garganta)  Zyrtec  Estreimiento:  Colace  Ducolax (supositorios)  Fleet enema (lavado intestinal  rectal)  Glycerin (supositorios)  Metamucil  Milk of magnesia (leche de magnesia)  Miralax  Senokot  Smooth Move (t)  Diarrea:  Kaopectate Imodium A-D  *NO tome Pepto-Bismol  Hemorroides:  Anusol  Anusol HC  Preparation H  Tucks  Indigestin:  Tums  Maalox  Mylanta  Zantac  Pepcid  Insomnia:  Benadryl (sin alcohol) 25mg  cada 6 horas segn lo necesite  Tylenol PM  Unisom, no Gelcaps  Calambres en las piernas:  Tums  MagGel Nuseas/Vmitos:  Bonine  Dramamine  Emetrol  Ginger (extracto)  Sea-Bands  Meclizine  Medicina para las nuseas que puede tomar durante el embarazo: Unisom (doxylamine succinate, pastillas de 25 mg) Tome una pastilla al da al [place]. Si los sntomas no estn adecuadamente controlados, la dosis puede aumentarse hasta una dosis mxima recomendada de [employer] al da (1/2 pastilla por la [place]. [place], 1/2 pastilla a media tarde y Neomia Dear pastilla al [place]). Pastillas de Vitamina B6 de 100mg . Tome ConAgra Foods veces al da (hasta 200 mg por da).  Erupciones en la piel:  Productos de Aveeno  Benadryl cream (crema o una dosis de 25mg  cada 6 horas segn lo necesite)  Calamine Lotion (locin)  1% cortisone cream (crema de cortisona de 1%)  nfeccin vaginal por hongos (candidiasis):  Gyne-lotrimin 7  Monistat 7   **Si est tomando varias medicinas, por favor revise las etiquetas para Art gallery manager los mismos ingredientes [place]. **Tome la medicina segn lo indicado en la etiqueta. **No tome ms de 400 mg de Tylenol en 24 horas. **No tome medicinas que contengan aspirina o ibuprofeno.

## 2016-06-25 NOTE — Progress Notes (Signed)
  Subjective:    Whitney [name] is being seen today for her first obstetrical visit.  This is a planned pregnancy. She is at 602w4d gestation. Her obstetrical history is significant for previous c/section with subsequent VBAC. Relationship with FOB: spouse, living together. Patient does intend to breast feed. Pregnancy history fully reviewed.  Patient reports no complaints.  Review of Systems:   Review of Systems Review of Systems - History obtained from the patient General ROS: negative Gastrointestinal ROS: negative for - abdominal pain, constipation, diarrhea or nausea/vomiting Genito-Urinary ROS: negative for - genital discharge, vulvar/vaginal symptoms or vaginal bleeding  Objective:     BP (!) 98/56   Pulse 65   Ht 5\' 3"  (1.6 m)   Wt 167 lb 11.2 oz (76.1 kg)   LMP 04/26/2016   BMI 29.71 kg/m  Physical Exam  Exam General appearance: alert, cooperative, appears stated age and mildly obese Neck: no adenopathy, supple, symmetrical, trachea midline and thyroid not enlarged, symmetric, no tenderness/mass/nodules Lungs: clear to auscultation bilaterally Breasts: normal appearance, no masses or tenderness Heart: regular rate and rhythm, S1, S2 normal, no murmur, click, rub or gallop Abdomen: soft, non-tender; bowel sounds normal; no masses,  no organomegaly. Bimanual exam - no CMT, cervix closed, uterus enlarged Skin: Skin color, texture, turgor normal. No rashes or lesions    Assessment:    Pregnancy: [id number] Patient Active Problem List   Diagnosis Date Noted  . Supervision of low-risk pregnancy 06/25/2016  . History of vaginal birth after cesarean 06/25/2016  . Language barrier affecting health care 06/25/2016       Plan:   1. Encounter for supervision of low-risk pregnancy, antepartum - Pt interested in Babyscripts -- eligible for optimized scheduling - Prenatal Profile - Hemoglobinopathy Evaluation - GC/Chlamydia probe amp ([place] - Culture, OB  Urine - Pain Mgmt, Profile 6 Conf w/o mM, U - Flu Vaccine QUAD 36+ mos IM - Hemoglobin A1c  2. History of vaginal birth after cesarean -G1 c/section for malpresentation, G2 successful VBAC -pt desires TOLAC  3. Language barrier affecting health care -Spanish interpreter at bedside     Initial labs drawn. Prenatal vitamins. Problem list reviewed and updated. AFP3 discussed: declined. Role of ultrasound in pregnancy discussed; fetal survey: will order anatomy scan at next visit. Follow up in 4 weeks.    Judeth Hornrin Ramani Riva 06/25/2016

## 2016-06-26 LAB — PAIN MGMT, PROFILE 6 CONF W/O MM, U
6 ACETYLMORPHINE: NEGATIVE ng/mL (ref <10)
AMPHETAMINES: NEGATIVE ng/mL (ref <500)
Alcohol Metabolites: NEGATIVE ng/mL (ref <500)
BARBITURATES: NEGATIVE ng/mL (ref <300)
Benzodiazepines: NEGATIVE ng/mL (ref <100)
COCAINE METABOLITE: NEGATIVE ng/mL (ref <150)
Creatinine: 173.4 mg/dL (ref >=20.0)
Marijuana Metabolite: NEGATIVE ng/mL (ref <20)
Methadone Metabolite: NEGATIVE ng/mL (ref <100)
OXIDANT: NEGATIVE ug/mL (ref <200)
OXYCODONE: NEGATIVE ng/mL (ref <100)
Opiates: NEGATIVE ng/mL (ref <100)
Phencyclidine: NEGATIVE ng/mL (ref <25)
Please note:: 0
pH: 6.31 (ref 4.5–9.0)

## 2016-06-26 LAB — PRENATAL PROFILE (SOLSTAS)
Antibody Screen: NEGATIVE
BASOS ABS: 0 {cells}/uL (ref 0–200)
Basophils Relative: 0 %
EOS PCT: 1 %
Eosinophils Absolute: 87 cells/uL (ref 15–500)
HEMATOCRIT: 36.4 % (ref 35.0–45.0)
HEMOGLOBIN: 12 g/dL (ref 11.7–15.5)
HEP B S AG: NEGATIVE
HIV: NONREACTIVE
LYMPHS ABS: 2349 {cells}/uL (ref 850–3900)
Lymphocytes Relative: 27 %
MCH: 28.6 pg (ref 27.0–33.0)
MCHC: 33 g/dL (ref 32.0–36.0)
MCV: 86.9 fL (ref 80.0–100.0)
MONO ABS: 522 {cells}/uL (ref 200–950)
MPV: 9 fL (ref 7.5–12.5)
Monocytes Relative: 6 %
NEUTROS ABS: 5742 {cells}/uL (ref 1500–7800)
NEUTROS PCT: 66 %
Platelets: 314 10*3/uL (ref 140–400)
RBC: 4.19 MIL/uL (ref 3.80–5.10)
RDW: 13.9 % (ref 11.0–15.0)
RUBELLA: 24.9 {index} — AB (ref <0.90)
Rh Type: POSITIVE
WBC: 8.7 10*3/uL (ref 3.8–10.8)

## 2016-06-26 LAB — GC/CHLAMYDIA PROBE AMP (~~LOC~~) NOT AT ARMC
Chlamydia: NEGATIVE
NEISSERIA GONORRHEA: NEGATIVE

## 2016-06-26 LAB — CULTURE, OB URINE

## 2016-06-27 LAB — HEMOGLOBINOPATHY EVALUATION
HEMATOCRIT: 36.4 % (ref 35.0–45.0)
HEMOGLOBIN: 12 g/dL (ref 11.7–15.5)
Hgb A2 Quant: 2.4 % (ref 1.8–3.5)
Hgb A: 96.6 % (ref >96.0)
Hgb F Quant: <1 % (ref <2.0)
MCH: 28.6 pg (ref 27.0–33.0)
MCV: 86.9 fL (ref 80.0–100.0)
RBC: 4.19 MIL/uL (ref 3.80–5.10)
RDW: 13.9 % (ref 11.0–15.0)

## 2016-07-06 DIAGNOSIS — Z3490 Encounter for supervision of normal pregnancy, unspecified, unspecified trimester: Secondary | ICD-10-CM

## 2016-07-06 DIAGNOSIS — Z789 Other specified health status: Secondary | ICD-10-CM

## 2016-07-06 DIAGNOSIS — Z98891 History of uterine scar from previous surgery: Secondary | ICD-10-CM

## 2016-07-24 ENCOUNTER — Ambulatory Visit: Payer: Self-pay

## 2016-07-24 ENCOUNTER — Ambulatory Visit (INDEPENDENT_AMBULATORY_CARE_PROVIDER_SITE_OTHER): Payer: Self-pay | Admitting: Student

## 2016-07-24 VITALS — BP 109/59 | HR 70

## 2016-07-24 DIAGNOSIS — Z3491 Encounter for supervision of normal pregnancy, unspecified, first trimester: Secondary | ICD-10-CM

## 2016-07-24 DIAGNOSIS — O3680X0 Pregnancy with inconclusive fetal viability, not applicable or unspecified: Secondary | ICD-10-CM

## 2016-07-24 NOTE — Progress Notes (Signed)
   PRENATAL VISIT NOTE  Subjective:  Whitney [name] is a 31 y.o. G3P2002 at 7526w5d being seen today for ongoing prenatal care.  She is currently monitored for the following issues for this low-risk pregnancy and has Supervision of low-risk pregnancy; History of vaginal birth after cesarean; and Language barrier affecting health care on her problem list.  Patient reports no complaints.   . Vag. Bleeding: None.  Movement: Present. Denies leaking of fluid.   The following portions of the patient's history were reviewed and updated as appropriate: allergies, current medications, past family history, past medical history, past social history, past surgical history and problem list. Problem list updated.  Objective:   Vitals:   07/24/16 1010  BP: (!) 109/59  Pulse: 70    Fetal Status:     Movement: Present     General:  Alert, oriented and cooperative. Patient is in no acute distress.  Skin: Skin is warm and dry. No rash noted.   Cardiovascular: Normal heart rate noted  Respiratory: Normal respiratory effort, no problems with respiration noted  Abdomen: Soft, gravid, appropriate for gestational age. Pain/Pressure: Present     Pelvic:  Cervical exam deferred        Extremities: Normal range of motion.  Edema: None  Mental Status: Normal mood and affect. Normal behavior. Normal judgment and thought content.   Assessment and Plan:  Pregnancy: G3P2002 at 1926w5d  1. Encounter for supervision of low-risk pregnancy in first trimester -Pt is using babcy scripts program -- will return at week 20 -Anatomy ultrasound being scheduled through adopt-a-mom program - US OB Limited  2. Encounter to determine fetal viability of pregnancy, single or unspecified fetus -Unable to doppler FHT -- informal ultrasound performed by Diane Day confirms cardiac activity - US OB Limited  Preterm labor symptoms and general obstetric precautions including but not limited to vaginal bleeding, contractions, leaking  of fluid and fetal movement were reviewed in detail with the patient. Please refer to After Visit Summary for other counseling recommendations.  Return in about 8 weeks (around 09/18/2016) for Routine OB.   Judeth HornErin Ry Moody, NP

## 2016-07-24 NOTE — Progress Notes (Signed)
Pt informed that the ultrasound is considered a limited OB ultrasound and is not intended to be a complete ultrasound exam.  Patient also informed that the ultrasound is not being completed with the intent of assessing for fetal or placental anomalies or any pelvic abnormalities.  Explained that the purpose of today's ultrasound is to assess for viability and estimated EGA.  Patient acknowledges the purpose of the exam and the limitations of the study.    Single IUP FHR - 154 bpm per PW doppler FM present CRL - 4.69 cm = 11w 3d FL -    0.782 = 12w 2d

## 2016-07-24 NOTE — Patient Instructions (Signed)

## 2016-08-06 ENCOUNTER — Telehealth: Payer: Self-pay | Admitting: *Deleted

## 2016-08-06 NOTE — Telephone Encounter (Signed)
Scheduled patient for her anatomy scan on 09/03/16 at 0900. [facility] is [facility]  Attempted to call patient with this appointment but was unable to reach her. Will try again.

## 2016-08-09 ENCOUNTER — Encounter: Payer: Self-pay | Admitting: *Deleted

## 2016-09-18 ENCOUNTER — Ambulatory Visit (INDEPENDENT_AMBULATORY_CARE_PROVIDER_SITE_OTHER): Payer: Self-pay | Admitting: Obstetrics and Gynecology

## 2016-09-18 DIAGNOSIS — Z3482 Encounter for supervision of other normal pregnancy, second trimester: Secondary | ICD-10-CM

## 2016-09-18 DIAGNOSIS — Z3492 Encounter for supervision of normal pregnancy, unspecified, second trimester: Secondary | ICD-10-CM

## 2016-09-18 LAB — POCT URINALYSIS DIP (DEVICE)
Bilirubin Urine: NEGATIVE
GLUCOSE, UA: NEGATIVE mg/dL
KETONES UR: NEGATIVE mg/dL
NITRITE: NEGATIVE
PH: 7 (ref 5.0–8.0)
PROTEIN: NEGATIVE mg/dL
Specific Gravity, Urine: 1.01 (ref 1.005–1.030)
UROBILINOGEN UA: 0.2 mg/dL (ref 0.0–1.0)

## 2016-09-18 NOTE — Progress Notes (Signed)
Pt. states that she has been having lots of pressure in her cervix area start 3 weeks ago.

## 2016-09-18 NOTE — Progress Notes (Signed)
   PRENATAL VISIT NOTE  Subjective:  Whitney [name] is a 31 y.o. G3P2002 at [redacted]w[redacted]d being seen today for ongoing prenatal care.  She is currently monitored for the following issues for this low-risk pregnancy and has Supervision of low-risk pregnancy; History of vaginal birth after cesarean; and Language barrier affecting health care on her problem list.  Patient reports "random pressure at cervix x 3 wks".  Contractions: Not present. Vag. Bleeding: None.  Movement: Present. Denies leaking of fluid.   The following portions of the patient's history were reviewed and updated as appropriate: allergies, current medications, past family history, past medical history, past social history, past surgical history and problem list. Problem list updated.  Objective:   Vitals:   09/18/16 1034 09/18/16 1041  BP: (!) 71/52 94/73  Pulse: 80   Weight: 173 lb 3.2 oz (78.6 kg)     Fetal Status: Fetal Heart Rate (bpm): 140   Movement: Present     General:  Alert, oriented and cooperative. Patient is in no acute distress.  Skin: Skin is warm and dry. No rash noted.   Cardiovascular: Normal heart rate noted  Respiratory: Normal respiratory effort, no problems with respiration noted  Abdomen: Soft, gravid, appropriate for gestational age. Pain/Pressure: Present     Pelvic:  Cervical exam performed Dilation: Closed Effacement (%): Thick  wet prep done  Extremities: Normal range of motion.  Edema: None  Mental Status: Normal mood and affect. Normal behavior. Normal judgment and thought content.   Reviewed 18 wks anatomy US from [facility] / normal 4.4 cm cervical length / cystic structure noted in the vicinity of fetal LT kidney  Assessment and Plan:  Pregnancy: G3P2002 at [redacted]w[redacted]d  1. Encounter for supervision of low-risk pregnancy in second trimester  - Cervicovaginal ancillary only  Preterm labor symptoms and general obstetric precautions including but not limited to vaginal bleeding, contractions, leaking  of fluid and fetal movement were reviewed in detail with the patient. Please refer to After Visit Summary for other counseling recommendations. F/U US at [facility] scheduled for 2 wks.  Return in about 2 months (around 11/18/2016) for Return OB visit.   Raelyn Mora, CNM

## 2016-09-18 NOTE — Progress Notes (Signed)
Video interpreter # [phone]

## 2016-09-18 NOTE — Patient Instructions (Signed)

## 2016-09-18 NOTE — Progress Notes (Signed)
OB US scheduled at [facility] on April 23rd @ 1000.  Pt notified.

## 2016-09-19 LAB — CERVICOVAGINAL ANCILLARY ONLY
BACTERIAL VAGINITIS: NEGATIVE
Trichomonas: NEGATIVE

## 2016-09-20 ENCOUNTER — Telehealth: Payer: Self-pay

## 2016-09-20 NOTE — Telephone Encounter (Signed)
Called patient concerning baby scripts log for her blood pressure. Patient has not been logging blood pressure. She stated she will do a better  job at taking her blood pressure.

## 2016-11-07 ENCOUNTER — Ambulatory Visit (INDEPENDENT_AMBULATORY_CARE_PROVIDER_SITE_OTHER): Payer: Self-pay | Admitting: Advanced Practice Midwife

## 2016-11-07 VITALS — BP 93/50 | HR 82 | Wt 179.4 lb

## 2016-11-07 DIAGNOSIS — Z3492 Encounter for supervision of normal pregnancy, unspecified, second trimester: Secondary | ICD-10-CM

## 2016-11-07 DIAGNOSIS — Z3480 Encounter for supervision of other normal pregnancy, unspecified trimester: Secondary | ICD-10-CM

## 2016-11-07 DIAGNOSIS — Z3482 Encounter for supervision of other normal pregnancy, second trimester: Secondary | ICD-10-CM

## 2016-11-07 DIAGNOSIS — O34219 Maternal care for unspecified type scar from previous cesarean delivery: Secondary | ICD-10-CM

## 2016-11-07 DIAGNOSIS — O358XX0 Maternal care for other (suspected) fetal abnormality and damage, not applicable or unspecified: Secondary | ICD-10-CM

## 2016-11-07 DIAGNOSIS — O35EXX0 Maternal care for other (suspected) fetal abnormality and damage, fetal genitourinary anomalies, not applicable or unspecified: Secondary | ICD-10-CM | POA: Insufficient documentation

## 2016-11-07 DIAGNOSIS — Z789 Other specified health status: Secondary | ICD-10-CM

## 2016-11-07 DIAGNOSIS — Z98891 History of uterine scar from previous surgery: Secondary | ICD-10-CM

## 2016-11-07 MED ORDER — TETANUS-DIPHTH-ACELL PERTUSSIS 5-2.5-18.5 LF-MCG/0.5 IM SUSP: 0.5000 mL | Freq: Once | INTRAMUSCULAR | Status: DC

## 2016-11-07 NOTE — Patient Instructions (Signed)
Tercer trimestre de embarazo (Third Trimester of Pregnancy) El tercer trimestre comprende desde la semana29 hasta la semana42, es decir, desde el mes7 hasta el mes9. El tercer trimestre es un perodo en el que el feto crece rpidamente. Hacia el final del noveno mes, el feto mide alrededor de 20pulgadas (45cm) de largo y pesa entre 6 y 10 libras (2,700 y 4,500kg). CAMBIOS EN EL ORGANISMO Su organismo atraviesa por muchos cambios durante el embarazo, y estos varan de una mujer a otra.  Seguir aumentando de peso. Es de esperar que aumente entre 25 y 35libras (11 y 16kg) hacia el final del embarazo.  Podrn aparecer las primeras estras en las caderas, el abdomen y las mamas.  Puede tener necesidad de orinar con ms frecuencia porque el feto baja hacia la pelvis y ejerce presin sobre la vejiga.  Debido al embarazo podr sentir acidez estomacal con frecuencia.  Puede estar estreida, ya que ciertas hormonas enlentecen los movimientos de los msculos que empujan los desechos a travs de los intestinos.  Pueden aparecer hemorroides o abultarse e hincharse las venas (venas varicosas).  Puede sentir dolor plvico debido al aumento de peso y a que las hormonas del embarazo relajan las articulaciones entre los huesos de la pelvis. El dolor de espalda puede ser consecuencia de la sobrecarga de los msculos que soportan la postura.  Tal vez haya cambios en el cabello que pueden incluir su engrosamiento, crecimiento rpido y cambios en la textura. Adems, a algunas mujeres se les cae el cabello durante o despus del embarazo, o tienen el cabello seco o fino. Lo ms probable es que el cabello se le normalice despus del nacimiento del beb.  Las mamas seguirn creciendo y le dolern. A veces, puede haber una secrecin amarilla de las mamas llamada calostro.  El ombligo puede salir hacia afuera.  Puede sentir que le falta el aire debido a que se expande el tero.  Puede notar que el feto  "baja" o lo siente ms bajo, en el abdomen.  Puede tener una prdida de secrecin mucosa con sangre. Esto suele ocurrir en el trmino de unos pocos das a una semana antes de que comience el trabajo de parto.  El cuello del tero se vuelve delgado y blando (se borra) cerca de la fecha de parto. QU DEBE ESPERAR EN LOS EXMENES PRENATALES Le harn exmenes prenatales cada 2semanas hasta la semana36. A partir de ese momento le harn exmenes semanales. Durante una visita prenatal de rutina:  La pesarn para asegurarse de que usted y el feto estn creciendo normalmente.  Le tomarn la presin arterial.  Le medirn el abdomen para controlar el desarrollo del beb.  Se escucharn los latidos cardacos fetales.  Se evaluarn los resultados de los estudios solicitados en visitas anteriores.  Le revisarn el cuello del tero cuando est prxima la fecha de parto para controlar si este se ha borrado. Alrededor de la semana36, el mdico le revisar el cuello del tero. Al mismo tiempo, realizar un anlisis de las secreciones del tejido vaginal. Este examen es para determinar si hay un tipo de bacteria, estreptococo Grupo B. El mdico le explicar esto con ms detalle. El mdico puede preguntarle lo siguiente:  Cmo le gustara que fuera el parto.  Cmo se siente.  Si siente los movimientos del beb.  Si ha tenido sntomas anormales, como prdida de lquido, sangrado, dolores de cabeza intensos o clicos abdominales.  Si est consumiendo algn producto que contenga tabaco, como cigarrillos, tabaco de mascar y   cigarrillos electrnicos.  Si tiene alguna pregunta. Otros exmenes o estudios de deteccin que pueden realizarse durante el tercer trimestre incluyen lo siguiente:  Anlisis de sangre para controlar los niveles de hierro (anemia).  Controles fetales para determinar su salud, nivel de actividad y crecimiento. Si tiene alguna enfermedad o hay problemas durante el embarazo, le harn  estudios.  Prueba del VIH (virus de inmunodeficiencia humana). Si corre un riesgo alto, pueden realizarle una prueba de deteccin del VIH durante el tercer trimestre del embarazo. FALSO TRABAJO DE PARTO Es posible que sienta contracciones leves e irregulares que finalmente desaparecen. Se llaman contracciones de Braxton Hicks o falso trabajo de parto. Las contracciones pueden durar horas, das o incluso semanas, antes de que el verdadero trabajo de parto se inicie. Si las contracciones ocurren a intervalos regulares, se intensifican o se hacen dolorosas, lo mejor es que la revise el mdico. SIGNOS DE TRABAJO DE PARTO  Clicos de tipo menstrual.  Contracciones cada 5minutos o menos.  Contracciones que comienzan en la parte superior del tero y se extienden hacia abajo, a la zona inferior del abdomen y la espalda.  Sensacin de mayor presin en la pelvis o dolor de espalda.  Una secrecin de mucosidad acuosa o con sangre que sale de la vagina. Si tiene alguno de estos signos antes de la semana37 del embarazo, llame a su mdico de inmediato. Debe concurrir al hospital para que la controlen inmediatamente. INSTRUCCIONES PARA EL CUIDADO EN EL HOGAR  Evite fumar, consumir hierbas, beber alcohol y tomar frmacos que no le hayan recetado. Estas sustancias qumicas afectan la formacin y el desarrollo del beb.  No consuma ningn producto que contenga tabaco, lo que incluye cigarrillos, tabaco de mascar y cigarrillos electrnicos. Si necesita ayuda para dejar de fumar, consulte al mdico. Puede recibir asesoramiento y otro tipo de recursos para dejar de fumar.  Siga las indicaciones del mdico en relacin con el uso de medicamentos. Durante el embarazo, hay medicamentos que son seguros de tomar y otros que no.  Haga ejercicio solamente como se lo haya indicado el mdico. Sentir clicos uterinos es un buen signo para detener la actividad fsica.  Contine comiendo alimentos sanos con  regularidad.  Use un sostn que le brinde buen soporte si le duelen las mamas.  No se d baos de inmersin en agua caliente, baos turcos ni saunas.  Use el cinturn de seguridad en todo momento mientras conduce.  No coma carne cruda ni queso sin cocinar; evite el contacto con las bandejas sanitarias de los gatos y la tierra que estos animales usan. Estos elementos contienen grmenes que pueden causar defectos congnitos en el beb.  Tome las vitaminas prenatales.  Tome entre 1500 y 2000mg de calcio diariamente comenzando en la semana20 del embarazo hasta el parto.  Si est estreida, pruebe un laxante suave (si el mdico lo autoriza). Consuma ms alimentos ricos en fibra, como vegetales y frutas frescos y cereales integrales. Beba gran cantidad de lquido para mantener la orina de tono claro o color amarillo plido.  Dese baos de asiento con agua tibia para aliviar el dolor o las molestias causadas por las hemorroides. Use una crema para las hemorroides si el mdico la autoriza.  Si tiene venas varicosas, use medias de descanso. Eleve los pies durante 15minutos, 3 o 4veces por da. Limite el consumo de sal en su dieta.  Evite levantar objetos pesados, use zapatos de tacones bajos y mantenga una buena postura.  Descanse con las piernas elevadas si tiene   calambres o dolor de cintura.  Visite a su dentista si no lo ha hecho durante el embarazo. Use un cepillo de dientes blando para higienizarse los dientes y psese el hilo dental con suavidad.  Puede seguir manteniendo relaciones sexuales, a menos que el mdico le indique lo contrario.  No haga viajes largos excepto que sea absolutamente necesario y solo con la autorizacin del mdico.  Tome clases prenatales para entender, practicar y hacer preguntas sobre el trabajo de parto y el parto.  Haga un ensayo de la partida al hospital.  Prepare el bolso que llevar al hospital.  Prepare la habitacin del beb.  Concurra a todas  las visitas prenatales segn las indicaciones de su mdico.  SOLICITE ATENCIN MDICA SI:  No est segura de que est en trabajo de parto o de que ha roto la bolsa de las aguas.  Tiene mareos.  Siente clicos leves, presin en la pelvis o dolor persistente en el abdomen.  Tiene nuseas, vmitos o diarrea persistentes.  Observa una secrecin vaginal con mal olor.  Siente dolor al orinar.  SOLICITE ATENCIN MDICA DE INMEDIATO SI:  Tiene fiebre.  Tiene una prdida de lquido por la vagina.  Tiene sangrado o pequeas prdidas vaginales.  Siente dolor intenso o clicos en el abdomen.  Sube o baja de peso rpidamente.  Tiene dificultad para respirar y siente dolor de pecho.  Sbitamente se le hinchan mucho el rostro, las manos, los tobillos, los pies o las piernas.  No ha sentido los movimientos del beb durante una hora.  Siente un dolor de cabeza intenso que no se alivia con medicamentos.  Su visin se modifica.  Esta informacin no tiene como fin reemplazar el consejo del mdico. Asegrese de hacerle al mdico cualquier pregunta que tenga. Document Released: 03/07/2005 Document Revised: 06/18/2014 Document Reviewed: 07/29/2012 Elsevier Interactive Patient Education  2017 Elsevier Inc.  

## 2016-11-07 NOTE — Progress Notes (Signed)
   PRENATAL VISIT NOTE  Subjective:  Whitney [name] is a 31 y.o. G3P2002 at 8437w6d being seen today for ongoing prenatal care.  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  Patient reports occasional contractions.  Contractions: Not present. Vag. Bleeding: None.  Movement: Present. Denies leaking of fluid.   The following portions of the patient's history were reviewed and updated as appropriate: allergies, current medications, past family history, past medical history, past social history, past surgical history and problem list. Problem list updated.  Discussed that F/U US since last visit showed multiple cysts in left Kidney and F/U US is recommended.   Objective:   Vitals:   11/07/16 0832  BP: (!) 93/50  Pulse: 82  Weight: 179 lb 6.4 oz (81.4 kg)    Fetal Status: Fetal Heart Rate (bpm): 142 Fundal Height: 27 cm Movement: Present  Presentation: Vertex  General:  Alert, oriented and cooperative. Patient is in no acute distress.  Skin: Skin is warm and dry. No rash noted.   Cardiovascular: Normal heart rate noted  Respiratory: Normal respiratory effort, no problems with respiration noted  Abdomen: Soft, gravid, appropriate for gestational age. Pain/Pressure: Absent     Pelvic:  Cervical exam performed Dilation: Closed Effacement (%): 0 Station: Ballotable  Extremities: Normal range of motion.  Edema: None  Mental Status: Normal mood and affect. Normal behavior. Normal judgment and thought content.   Assessment and Plan:  Pregnancy: G3P2002 at 5037w6d  1. Supervision of other normal pregnancy, antepartum  - Glucose Tolerance, 2 Hours w/1 Hour - CBC - RPR - HIV antibody (with reflex) - Tdap (BOOSTRIX) injection 0.5 mL; Inject 0.5 mLs into the muscle  once.  2. Encounter for supervision of low-risk pregnancy in second trimester   3. 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 History of vaginal birth after cesarean - Plans VBAC, but Left before VBAC consent signed.   5. Language barrier affecting health care   Preterm labor symptoms and general obstetric precautions including but not limited to vaginal bleeding, contractions, leaking of fluid and fetal movement were reviewed in detail with the patient. Please refer to After Visit Summary for other counseling recommendations.  Return in about 4 weeks (around 12/05/2016) for ROB.   Dorathy KinsmanVirginia Zoee Heeney, CNM

## 2016-11-08 LAB — CBC
Hematocrit: 31.6 % — ABNORMAL LOW (ref 34.0–46.6)
Hemoglobin: 10.3 g/dL — ABNORMAL LOW (ref 11.1–15.9)
MCH: 28.5 pg (ref 26.6–33.0)
MCHC: 32.6 g/dL (ref 31.5–35.7)
MCV: 87 fL (ref 79–97)
PLATELETS: 245 10*3/uL (ref 150–379)
RBC: 3.62 x10E6/uL — AB (ref 3.77–5.28)
RDW: 13.6 % (ref 12.3–15.4)
WBC: 9.8 10*3/uL (ref 3.4–10.8)

## 2016-11-08 LAB — GLUCOSE TOLERANCE, 2 HOURS W/ 1HR
GLUCOSE, 1 HOUR: 98 mg/dL (ref 65–179)
GLUCOSE, 2 HOUR: 103 mg/dL (ref 65–152)
GLUCOSE, FASTING: 74 mg/dL (ref 65–91)

## 2016-11-08 LAB — HIV ANTIBODY (ROUTINE TESTING W REFLEX): HIV Screen 4th Generation wRfx: NONREACTIVE

## 2016-11-08 LAB — RPR: RPR: NONREACTIVE

## 2016-11-13 ENCOUNTER — Ambulatory Visit (HOSPITAL_COMMUNITY)
Admission: RE | Admit: 2016-11-13 | Discharge: 2016-11-13 | Disposition: A | Discharge status: Home / Self Care | Payer: Self-pay | Source: Ambulatory Visit | Attending: Advanced Practice Midwife | Admitting: Advanced Practice Midwife

## 2016-11-13 ENCOUNTER — Encounter (HOSPITAL_COMMUNITY): Payer: Self-pay

## 2016-11-13 DIAGNOSIS — O358XX0 Maternal care for other (suspected) fetal abnormality and damage, not applicable or unspecified: Secondary | ICD-10-CM | POA: Insufficient documentation

## 2016-11-13 DIAGNOSIS — O99213 Obesity complicating pregnancy, third trimester: Secondary | ICD-10-CM | POA: Insufficient documentation

## 2016-11-13 DIAGNOSIS — O35EXX0 Maternal care for other (suspected) fetal abnormality and damage, fetal genitourinary anomalies, not applicable or unspecified: Secondary | ICD-10-CM

## 2016-11-13 DIAGNOSIS — E669 Obesity, unspecified: Secondary | ICD-10-CM | POA: Insufficient documentation

## 2016-11-13 DIAGNOSIS — O321XX0 Maternal care for breech presentation, not applicable or unspecified: Secondary | ICD-10-CM | POA: Insufficient documentation

## 2016-11-13 DIAGNOSIS — Z3A28 28 weeks gestation of pregnancy: Secondary | ICD-10-CM | POA: Insufficient documentation

## 2016-11-13 DIAGNOSIS — Q614 Renal dysplasia: Secondary | ICD-10-CM | POA: Insufficient documentation

## 2016-11-13 IMAGING — US US MFM OB DETAIL+14 WK
1 series · 14 of 28 positions shown · non-contrast
Comparison: none

[Series 1: us mfm ob detail+14 wk · 80 acquisitions, 14 frames shown]
[im 3/80]
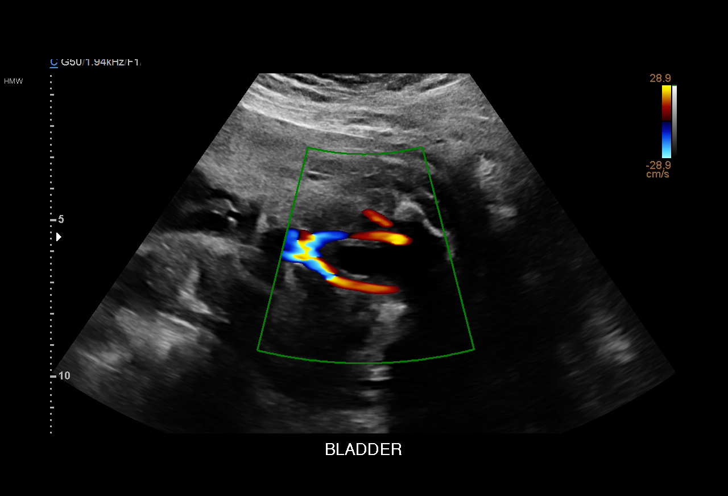
[im 9/80]
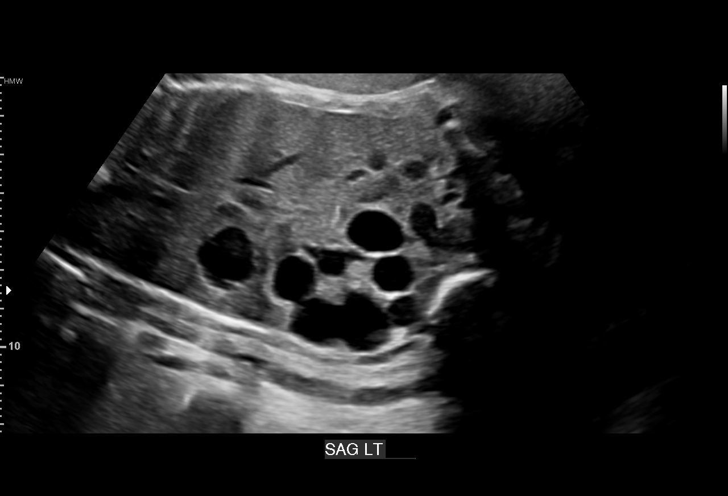
[im 15/80]
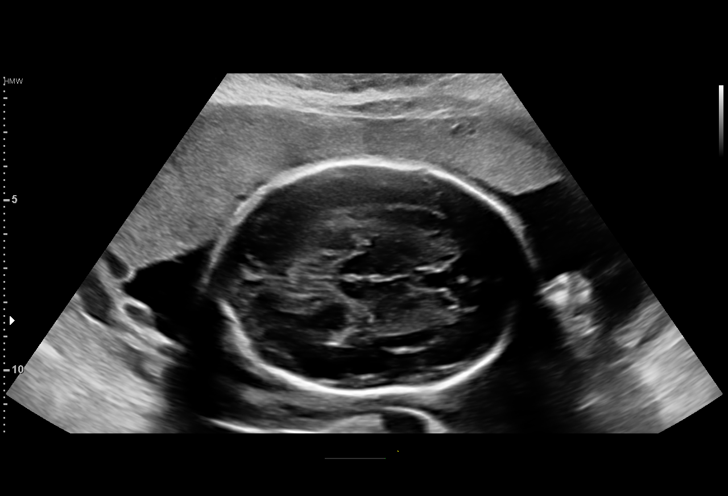
[im 21/80]
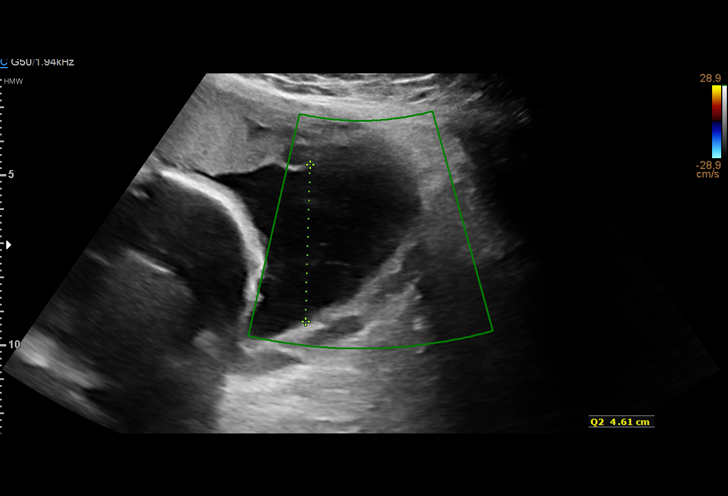
[im 27/80]
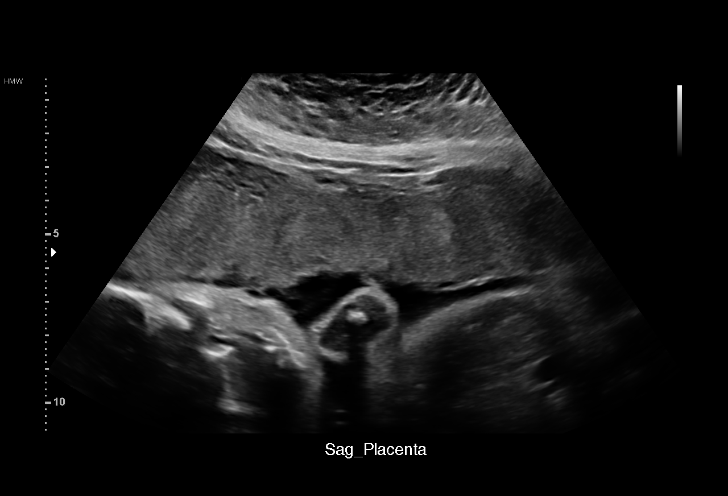
[im 33/80]
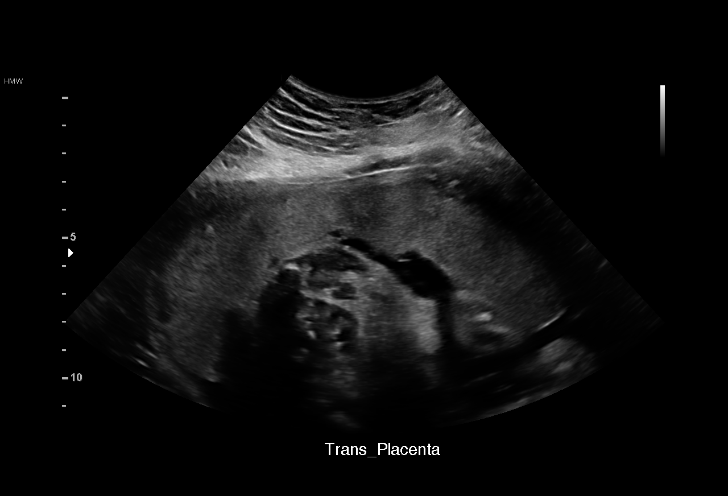
[im 39/80]
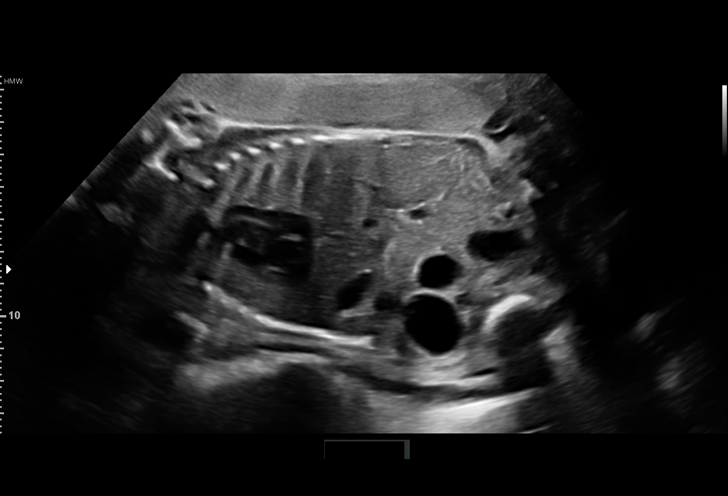
[im 44/80]
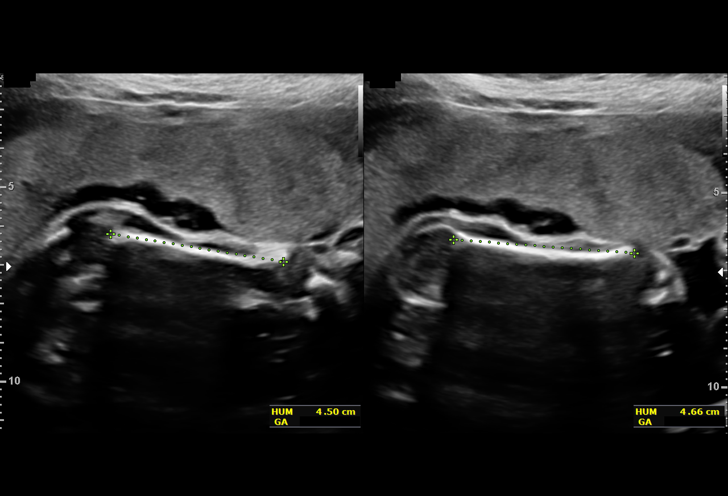
[im 50/80]
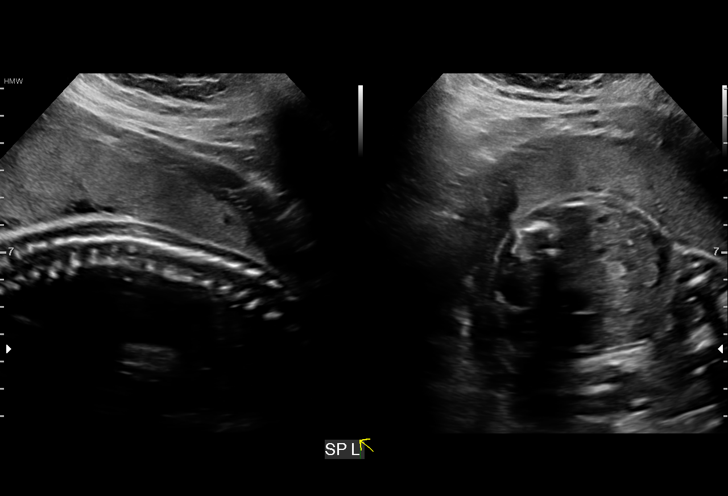
[im 56/80]
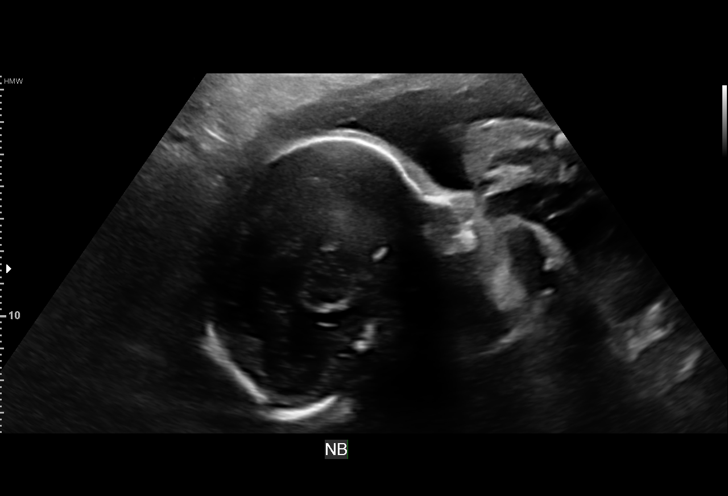
[im 62/80]
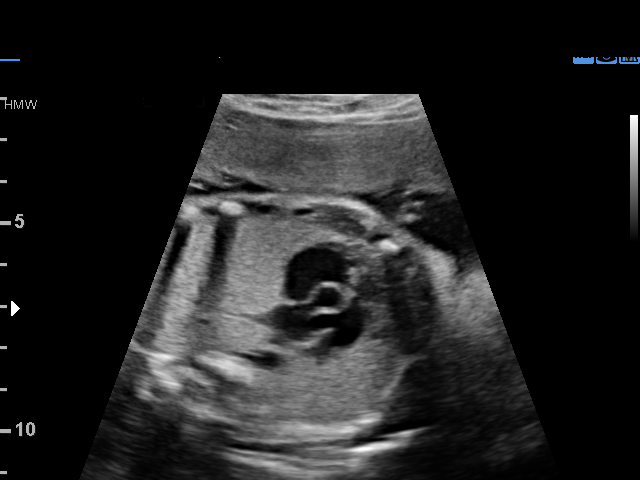
[im 68/80]
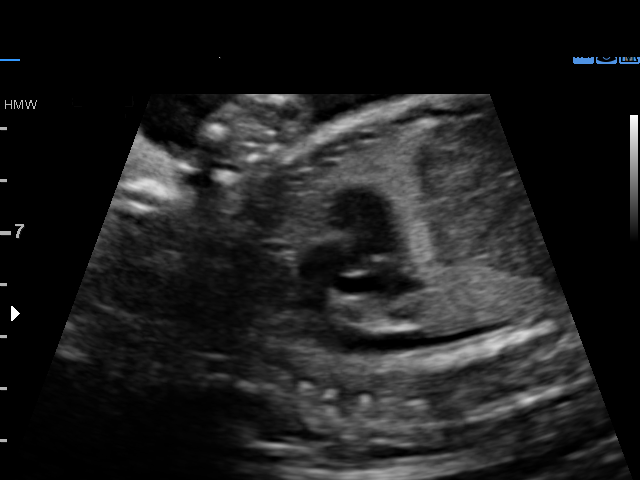
[im 74/80]
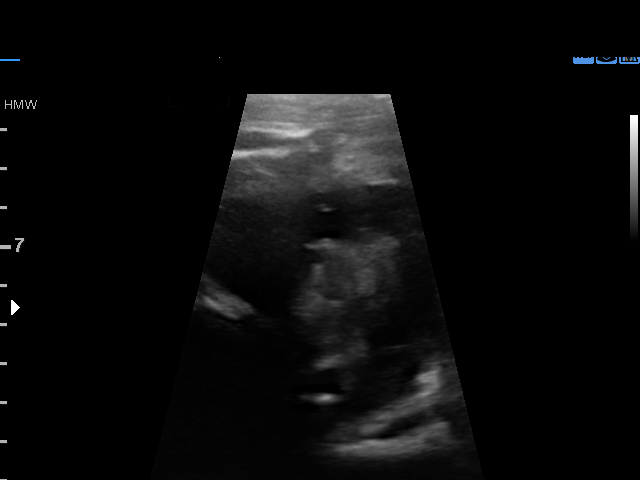
[im 80/80]
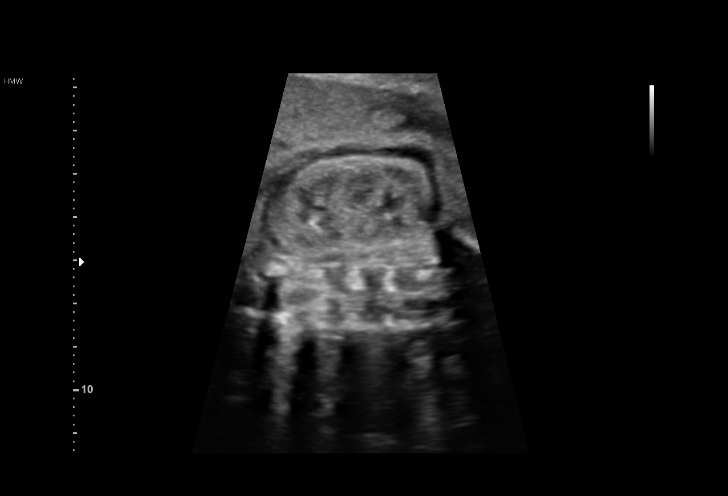

[14 of 28 positions shown; findings below may reference images not displayed]

1  AKIRA           [PHONE_NUMBER]      [PHONE_NUMBER]     [PHONE_NUMBER]
Indications

28 weeks gestation of pregnancy
Encounter for antenatal screening for          [3O]
malformations
Fetal abnormality - other known or             [3O]
suspected (left multicystic kidney)
Obesity complicating pregnancy, third          [3O]
trimester
OB History

Blood Type:            Height:  5'3"   Weight (lb):  182      BMI:
Gravidity:    3         Term:   2        Prem:   0        SAB:   0
TOP:          0       Ectopic:  0        Living: 20
Fetal Evaluation

Num Of Fetuses:     1
Fetal Heart         143
Rate(bpm):
Cardiac Activity:   Observed
Presentation:       Breech
Placenta:           Anterior, above cervical os
P. Cord Insertion:  Visualized, central

Amniotic Fluid
AFI FV:      Subjectively within normal limits

AFI Sum(cm)     %Tile       Largest Pocket(cm)
9.27            5

RUQ(cm)       RLQ(cm)       LUQ(cm)        LLQ(cm)
2.77          0
Biometry

BPD:      67.7  mm     G. Age:  27w 2d          6  %    CI:        67.35   %   70 - 86
FL/HC:      17.5   %   19.6 -
HC:      264.1  mm     G. Age:  28w 5d         20  %    HC/AC:      1.11       0.99 -
AC:      237.6  mm     G. Age:  28w 0d         24  %    FL/BPD:     68.2   %   71 - 87
FL:       46.2  mm     G. Age:  25w 3d        < 3  %    FL/AC:      19.4   %   20 - 24
HUM:      45.8  mm     G. Age:  27w 0d          9  %

Est. FW:    [3O]  gm      2 lb 4 oz     23  %
Gestational Age

LMP:           28w 5d       Date:   [DATE]                 EDD:   [DATE]
U/S Today:     27w 3d                                        EDD:   [DATE]
Best:          28w 5d    Det. By:   LMP  ([DATE])          EDD:   [DATE]
Anatomy

Cranium:               Appears normal         Aortic Arch:            Appears normal
Cavum:                 Appears normal         Ductal Arch:            Not well visualized
Ventricles:            Appears normal         Diaphragm:              Appears normal
Choroid Plexus:        Appears normal         Stomach:                Appears normal, left
sided
Cerebellum:            Appears normal         Abdomen:                Appears normal
Posterior Fossa:       Appears normal         Abdominal Wall:         Not well visualized
Nuchal Fold:           Not applicable (>20    Cord Vessels:           Appears normal (3
wks GA)                                        vessel cord)
Face:                  Appears normal         Kidneys:                Multicystic
(orbits and profile)
dysplastic(LT)
Lips:                  Appears normal         Bladder:                Appears normal
Thoracic:              Appears normal         Spine:                  Limited views
appear normal
Heart:                 Appears normal         Upper Extremities:      Appears normal
(4CH, axis, and
situs)
RVOT:                  Appears normal         Lower Extremities:      Appears normal
LVOT:                  Appears normal

Other:  Technically difficult due to fetal position.
Cervix Uterus Adnexa

Cervix
Normal appearance by transabdominal scan.

Uterus
No abnormality visualized.

Left Ovary
No adnexal mass visualized.

Right Ovary
No adnexal mass visualized.

Cul De Sac:   No free fluid seen.

Adnexa:       No abnormality visualized.
Impression

SIUP at 28+5 weeks
Multicystic, dysplastic kidney on left; normal kidney on right
All other detailed fetal anatomy was seen and appeared
normal; limited views of the DA and CI
Normal amniotic fluid volume
Measurements consistent with LMP dating; EFW at the 23rd
%tile

The US findings were shared with Ms. AKIRA. The
implications of a unilateral AKIRA kidney were discussed in
detail. Prognosis is excellent.
Recommendations

Follow-up ultrasound in 4 weeks to reassess kidneys
Pediatric urology consultation prenatally
Routine prenatal care and delivery

## 2016-11-20 ENCOUNTER — Other Ambulatory Visit (HOSPITAL_COMMUNITY): Payer: Self-pay | Admitting: *Deleted

## 2016-11-20 DIAGNOSIS — O359XX0 Maternal care for (suspected) fetal abnormality and damage, unspecified, not applicable or unspecified: Secondary | ICD-10-CM

## 2016-12-05 ENCOUNTER — Encounter: Payer: Self-pay | Admitting: Student

## 2016-12-11 ENCOUNTER — Ambulatory Visit (HOSPITAL_COMMUNITY)
Admission: RE | Admit: 2016-12-11 | Discharge: 2016-12-11 | Disposition: A | Discharge status: Home / Self Care | Payer: Self-pay | Source: Ambulatory Visit | Attending: Advanced Practice Midwife | Admitting: Advanced Practice Midwife

## 2016-12-11 ENCOUNTER — Encounter (HOSPITAL_COMMUNITY): Payer: Self-pay

## 2016-12-11 ENCOUNTER — Other Ambulatory Visit (HOSPITAL_COMMUNITY): Payer: Self-pay | Admitting: Maternal and Fetal Medicine

## 2016-12-11 DIAGNOSIS — O321XX0 Maternal care for breech presentation, not applicable or unspecified: Secondary | ICD-10-CM | POA: Insufficient documentation

## 2016-12-11 DIAGNOSIS — Q614 Renal dysplasia: Secondary | ICD-10-CM | POA: Insufficient documentation

## 2016-12-11 DIAGNOSIS — O359XX0 Maternal care for (suspected) fetal abnormality and damage, unspecified, not applicable or unspecified: Secondary | ICD-10-CM | POA: Insufficient documentation

## 2016-12-11 DIAGNOSIS — Z3A32 32 weeks gestation of pregnancy: Secondary | ICD-10-CM

## 2016-12-11 DIAGNOSIS — E669 Obesity, unspecified: Secondary | ICD-10-CM | POA: Insufficient documentation

## 2016-12-11 DIAGNOSIS — O99213 Obesity complicating pregnancy, third trimester: Secondary | ICD-10-CM

## 2016-12-13 ENCOUNTER — Ambulatory Visit (INDEPENDENT_AMBULATORY_CARE_PROVIDER_SITE_OTHER): Payer: Self-pay | Admitting: Student

## 2016-12-13 VITALS — BP 118/59 | HR 69 | Wt 186.1 lb

## 2016-12-13 DIAGNOSIS — Z789 Other specified health status: Secondary | ICD-10-CM

## 2016-12-13 DIAGNOSIS — Z3493 Encounter for supervision of normal pregnancy, unspecified, third trimester: Secondary | ICD-10-CM

## 2016-12-13 DIAGNOSIS — Z98891 History of uterine scar from previous surgery: Secondary | ICD-10-CM

## 2016-12-13 DIAGNOSIS — O358XX0 Maternal care for other (suspected) fetal abnormality and damage, not applicable or unspecified: Secondary | ICD-10-CM

## 2016-12-13 DIAGNOSIS — O35EXX0 Maternal care for other (suspected) fetal abnormality and damage, fetal genitourinary anomalies, not applicable or unspecified: Secondary | ICD-10-CM

## 2016-12-13 NOTE — Progress Notes (Addendum)
Data from [facility]:

## 2016-12-13 NOTE — Patient Instructions (Signed)
Contracciones de Braxton Hicks °(Braxton Hicks Contractions) °Durante el embarazo, pueden presentarse contracciones uterinas que no siempre indican que está en trabajo de parto. °¿QUÉ SON LAS CONTRACCIONES DE BRAXTON HICKS? °Las contracciones que se presentan antes del trabajo de parto se conocen como contracciones de Braxton Hicks o falso trabajo de parto. Hacia el final del embarazo (32 a 34 semanas), estas contracciones pueden aparecen con más frecuencia y volverse más intensas. No corresponden al trabajo de parto verdadero porque estas contracciones no producen el agrandamiento (la dilatación) y el afinamiento del cuello del útero. Algunas veces, es difícil distinguirlas del trabajo de parto verdadero porque en algunos casos pueden ser muy intensas, y las personas tienen diferentes niveles de tolerancia al dolor. No debe sentirse avergonzada si concurre al hospital con falso trabajo de parto. En ocasiones, la única forma de saber si el trabajo de parto es verdadero es que el médico determine si hay cambios en el cuello del útero. °Si no hay problemas prenatales u otras complicaciones de salud asociadas con el embarazo, no habrá inconvenientes si la envían a su casa con falso trabajo de parto y espera que comience el verdadero. °CÓMO DIFERENCIAR EL TRABAJO DE PARTO FALSO DEL VERDADERO °Falso trabajo de parto  °· Las contracciones del falso trabajo de parto duran menos y no son tan intensas como las verdaderas. °· Generalmente son irregulares. °· A menudo, se sienten en la parte delantera de la parte baja del abdomen y en la ingle, °· y pueden desaparecer cuando camina o cambia de posición mientras está acostada. °· Las contracciones se vuelven más débiles y su duración es menor a medida que el tiempo transcurre. °· Por lo general, no se hacen progresivamente más intensas, regulares y cercanas entre sí como en el caso del trabajo de parto verdadero. °Verdadero trabajo de parto  °· Las contracciones del verdadero  trabajo de parto duran de 30 a 70 segundos, son muy regulares y suelen volverse más intensas, y aumenta su frecuencia. °· No desaparecen cuando camina. °· La molestia generalmente se siente en la parte superior del útero y se extiende hacia la zona inferior del abdomen y hacia la cintura. °· El médico podrá examinarla para determinar si el trabajo de parto es verdadero. El examen mostrará si el cuello del útero se está dilatando y afinando. °LO QUE DEBE RECORDAR °· Continúe haciendo los ejercicios habituales y siga otras indicaciones que el médico le dé. °· Tome todos los medicamentos como le indicó el médico. °· Concurra a las visitas prenatales regulares. °· Coma y beba con moderación si cree que está en trabajo de parto. °· Si las contracciones de Braxton Hicks le provocan incomodidad: °¨ Cambie de posición: si está acostada o descansando, camine; si está caminando, descanse. °¨ Siéntese y descanse en una bañera con agua tibia. °¨ Beba 2 o 3 vasos de agua. La deshidratación puede provocar contracciones. °¨ Respire lenta y profundamente varias veces por hora. °¿CUÁNDO DEBO BUSCAR ASISTENCIA MÉDICA INMEDIATA? °Solicite atención médica de inmediato si: °· Las contracciones se intensifican, se hacen más regulares y cercanas entre sí. °· Tiene una pérdida de líquido por la vagina. °· Tiene fiebre. °· Elimina mucosidad manchada con sangre. °· Tiene una hemorragia vaginal abundante. °· Tiene dolor abdominal permanente. °· Tiene un dolor en la zona lumbar que nunca tuvo antes. °· Siente que la cabeza del bebé empuja hacia abajo y ejerce presión en la zona pélvica. °· El bebé no se mueve tanto como solía. °Esta información no tiene como fin reemplazar el   consejo del médico. Asegúrese de hacerle al médico cualquier pregunta que tenga. °Document Released: 03/07/2005 Document Revised: 09/19/2015 Document Reviewed: 03/09/2013 °Elsevier Interactive Patient Education © 2017 [employer] Inc[employer] ° °

## 2016-12-13 NOTE — Progress Notes (Signed)
C/o last few days c/o pain in lower back like" back is opening up- feels like cramps"- states happens occasionally.

## 2016-12-14 NOTE — Progress Notes (Signed)
   PRENATAL VISIT NOTE  Subjective:  Whitney [name] is a 31 y.o. G3P2002 at 7575w1d being seen today for ongoing prenatal care.  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  Patient reports backache.  Contractions: Irregular. Vag. Bleeding: None.  Movement: Present. Denies leaking of fluid.   The following portions of the patient's history were reviewed and updated as appropriate: allergies, current medications, past family history, past medical history, past social history, past surgical history and problem list. Problem list updated.  Objective:   Vitals:   12/13/16 1544  BP: (!) 118/59  Pulse: 69  Weight: 186 lb 1.6 oz (84.4 kg)    Fetal Status: Fetal Heart Rate (bpm): 146 Fundal Height: 32 cm Movement: Present     General:  Alert, oriented and cooperative. Patient is in no acute distress.  Skin: Skin is warm and dry. No rash noted.   Cardiovascular: Normal heart rate noted  Respiratory: Normal respiratory effort, no problems with respiration noted  Abdomen: Soft, gravid, appropriate for gestational age. Pain/Pressure: Present     Pelvic:  Cervical exam deferred        Extremities: Normal range of motion.  Edema: Trace  Mental Status: Normal mood and affect. Normal behavior. Normal judgment and thought content.   Assessment and Plan:  Pregnancy: G3P2002 at 3475w1d  1. Encounter for supervision of low-risk pregnancy in third trimester Patient doing well; reassured patient of normalcy of low back pain in pregnancy and recommended stretching and pregnancy support belt.   2. History of vaginal birth after cesarean VBAC consent signed today  3. Language barrier affecting health care   4. 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 Please refer to After Visit Summary for other counseling recommendations.  Return in about 4 weeks (around 01/10/2017) for LROB.   Whitney [name], CNM

## 2017-01-16 ENCOUNTER — Encounter: Payer: Self-pay | Admitting: Obstetrics and Gynecology

## 2017-01-16 ENCOUNTER — Ambulatory Visit (INDEPENDENT_AMBULATORY_CARE_PROVIDER_SITE_OTHER): Payer: Self-pay | Admitting: Obstetrics and Gynecology

## 2017-01-16 VITALS — BP 104/58 | HR 79 | Wt 190.3 lb

## 2017-01-16 DIAGNOSIS — Z113 Encounter for screening for infections with a predominantly sexual mode of transmission: Secondary | ICD-10-CM

## 2017-01-16 DIAGNOSIS — O35EXX0 Maternal care for other (suspected) fetal abnormality and damage, fetal genitourinary anomalies, not applicable or unspecified: Secondary | ICD-10-CM

## 2017-01-16 DIAGNOSIS — Z3493 Encounter for supervision of normal pregnancy, unspecified, third trimester: Secondary | ICD-10-CM

## 2017-01-16 DIAGNOSIS — O358XX0 Maternal care for other (suspected) fetal abnormality and damage, not applicable or unspecified: Secondary | ICD-10-CM

## 2017-01-16 LAB — OB RESULTS CONSOLE GC/CHLAMYDIA: GC PROBE AMP, GENITAL: NEGATIVE

## 2017-01-16 LAB — OB RESULTS CONSOLE GBS: GBS: NEGATIVE

## 2017-01-16 NOTE — Progress Notes (Signed)
[employer] interpreter [phone]

## 2017-01-16 NOTE — Progress Notes (Signed)
   PRENATAL VISIT NOTE  Subjective:  Whitney [name] is a 31 y.o. G3P2002 at 10667w6d being seen today for ongoing prenatal care.  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  Patient reports no complaints.  Contractions: Irregular. Vag. Bleeding: None.  Movement: Present. Denies leaking of fluid.   The following portions of the patient's history were reviewed and updated as appropriate: allergies, current medications, past family history, past medical history, past social history, past surgical history and problem list. Problem list updated.  Objective:   Vitals:   01/16/17 1044  BP: (!) 104/58  Pulse: 79  Weight: 190 lb 4.8 oz (86.3 kg)    Fetal Status: Fetal Heart Rate (bpm): 141 Fundal Height: 36 cm Movement: Present  Presentation: Vertex  General:  Alert, oriented and cooperative. Patient is in no acute distress.  Skin: Skin is warm and dry. No rash noted.   Cardiovascular: Normal heart rate noted  Respiratory: Normal respiratory effort, no problems with respiration noted  Abdomen: Soft, gravid, appropriate for gestational age.  Pain/Pressure: Present     Pelvic: Cervical exam performed Dilation: Fingertip Effacement (%): Thick Station: Ballotable  Extremities: Normal range of motion.  Edema: Trace  Mental Status:  Normal mood and affect. Normal behavior. Normal judgment and thought content.   Assessment and Plan:  Pregnancy: G3P2002 at 5767w6d  1. Encounter for supervision of low-risk pregnancy in third trimester  - Culture, beta strep (group b only) - GC/Chlamydia probe amp ([place])[facility] at [facility]  2. 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 Term labor symptoms and general obstetric precautions including but not limited to vaginal bleeding, contractions, leaking of fluid and fetal movement were reviewed in detail with the patient. Please refer to After Visit Summary for other counseling recommendations.  Return in about 1 week (around 01/23/2017).   Viana Sleep, Harolyn RutherfordJennifer I, NP

## 2017-01-17 LAB — GC/CHLAMYDIA PROBE AMP (~~LOC~~) NOT AT ARMC
CHLAMYDIA, DNA PROBE: NEGATIVE
Neisseria Gonorrhea: NEGATIVE

## 2017-01-19 LAB — CULTURE, BETA STREP (GROUP B ONLY): STREP GP B CULTURE: POSITIVE — AB

## 2017-01-22 ENCOUNTER — Ambulatory Visit (INDEPENDENT_AMBULATORY_CARE_PROVIDER_SITE_OTHER): Payer: Self-pay | Admitting: Obstetrics and Gynecology

## 2017-01-22 DIAGNOSIS — O99820 Streptococcus B carrier state complicating pregnancy: Secondary | ICD-10-CM | POA: Insufficient documentation

## 2017-01-22 DIAGNOSIS — Z3493 Encounter for supervision of normal pregnancy, unspecified, third trimester: Secondary | ICD-10-CM

## 2017-01-22 NOTE — Progress Notes (Signed)
   PRENATAL VISIT NOTE  Subjective:  Michal Primiano is a 31 y.o. G3P2002 at 320w5d being seen today for ongoing prenatal care.  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  Patient reports vaginal irritation.   Contractions: Irregular. Vag. Bleeding: None.  Movement: Present. Denies leaking of fluid.   The following portions of the patient's history were reviewed and updated as appropriate: allergies, current medications, past family history, past medical history, past social history, past surgical history and problem list. Problem list updated.  Objective:   Vitals:   01/22/17 0747  BP: (!) 100/56  Pulse: 69  Weight: 193 lb 4.8 oz (87.7 kg)    Fetal Status: Fetal Heart Rate (bpm): 137   Movement: Present     General:  Alert, oriented and cooperative. Patient is in no acute distress.  Skin: Skin is warm and dry. No rash noted.   Cardiovascular: Normal heart rate noted  Respiratory: Normal respiratory effort, no problems with respiration noted  Abdomen: Soft, gravid, appropriate for gestational age.  Pain/Pressure: Absent     Pelvic: Cervical exam deferred        Extremities: Normal range of motion.  Edema: Trace  Mental Status:  Normal mood and affect. Normal behavior. Normal judgment and thought content.   Assessment and Plan:  Pregnancy: G3P2002 at 1520w5d  1. Encounter for supervision of low-risk pregnancy in third trimester  - GBS positive, discussed results.   Term labor symptoms and general obstetric precautions including but not limited to vaginal bleeding, contractions, leaking of fluid and fetal movement were reviewed in detail with the patient. Please refer to After Visit Summary for other counseling recommendations.  Return in about 1 week  (around 01/29/2017).   Denson Niccoli, Harolyn RutherfordJennifer I, NP

## 2017-01-22 NOTE — Progress Notes (Signed)
[employer] interpreter Trilby LeaverViviana  [phone]

## 2017-01-30 ENCOUNTER — Inpatient Hospital Stay (HOSPITAL_COMMUNITY): Payer: Medicaid Other | Admitting: Anesthesiology

## 2017-01-30 ENCOUNTER — Inpatient Hospital Stay (HOSPITAL_COMMUNITY)
Admission: AD | Admit: 2017-01-30 | Discharge: 2017-02-01 | DRG: 766 | Disposition: A | Discharge status: Home / Self Care | Payer: Medicaid Other | Source: Ambulatory Visit | Attending: Obstetrics and Gynecology | Admitting: Obstetrics and Gynecology

## 2017-01-30 ENCOUNTER — Encounter: Payer: Self-pay | Admitting: Advanced Practice Midwife

## 2017-01-30 ENCOUNTER — Encounter (HOSPITAL_COMMUNITY)
Admission: AD | Disposition: A | Discharge status: Home / Self Care | Payer: Self-pay | Source: Ambulatory Visit | Attending: Obstetrics and Gynecology

## 2017-01-30 ENCOUNTER — Encounter (HOSPITAL_COMMUNITY): Payer: Self-pay

## 2017-01-30 DIAGNOSIS — O99820 Streptococcus B carrier state complicating pregnancy: Secondary | ICD-10-CM

## 2017-01-30 DIAGNOSIS — O321XX0 Maternal care for breech presentation, not applicable or unspecified: Secondary | ICD-10-CM | POA: Diagnosis present

## 2017-01-30 DIAGNOSIS — E669 Obesity, unspecified: Secondary | ICD-10-CM | POA: Diagnosis present

## 2017-01-30 DIAGNOSIS — O99824 Streptococcus B carrier state complicating childbirth: Secondary | ICD-10-CM | POA: Diagnosis present

## 2017-01-30 DIAGNOSIS — O358XX0 Maternal care for other (suspected) fetal abnormality and damage, not applicable or unspecified: Secondary | ICD-10-CM | POA: Diagnosis present

## 2017-01-30 DIAGNOSIS — Z6833 Body mass index (BMI) 33.0-33.9, adult: Secondary | ICD-10-CM | POA: Diagnosis not present

## 2017-01-30 DIAGNOSIS — O99214 Obesity complicating childbirth: Secondary | ICD-10-CM | POA: Diagnosis present

## 2017-01-30 DIAGNOSIS — Z3A39 39 weeks gestation of pregnancy: Secondary | ICD-10-CM

## 2017-01-30 DIAGNOSIS — Z789 Other specified health status: Secondary | ICD-10-CM

## 2017-01-30 DIAGNOSIS — O9902 Anemia complicating childbirth: Secondary | ICD-10-CM | POA: Diagnosis present

## 2017-01-30 DIAGNOSIS — O34211 Maternal care for low transverse scar from previous cesarean delivery: Secondary | ICD-10-CM | POA: Diagnosis present

## 2017-01-30 DIAGNOSIS — Z98891 History of uterine scar from previous surgery: Secondary | ICD-10-CM

## 2017-01-30 DIAGNOSIS — D649 Anemia, unspecified: Secondary | ICD-10-CM | POA: Diagnosis present

## 2017-01-30 DIAGNOSIS — O35EXX0 Maternal care for other (suspected) fetal abnormality and damage, fetal genitourinary anomalies, not applicable or unspecified: Secondary | ICD-10-CM

## 2017-01-30 LAB — CBC
HEMATOCRIT: 30.3 % — AB (ref 36.0–46.0)
HEMOGLOBIN: 10.1 g/dL — AB (ref 12.0–15.0)
MCH: 26.4 pg (ref 26.0–34.0)
MCHC: 33.3 g/dL (ref 30.0–36.0)
MCV: 79.3 fL (ref 78.0–100.0)
Platelets: 220 10*3/uL (ref 150–400)
RBC: 3.82 MIL/uL — ABNORMAL LOW (ref 3.87–5.11)
RDW: 14.8 % (ref 11.5–15.5)
WBC: 9.7 10*3/uL (ref 4.0–10.5)

## 2017-01-30 LAB — TYPE AND SCREEN
ABO/RH(D): B POS
Antibody Screen: NEGATIVE

## 2017-01-30 LAB — ABO/RH: ABO/RH(D): B POS

## 2017-01-30 LAB — RPR: RPR Ser Ql: NONREACTIVE

## 2017-01-30 SURGERY — Surgical Case
Anesthesia: Epidural | Site: Abdomen | Wound class: Clean Contaminated

## 2017-01-30 MED ORDER — DIPHENHYDRAMINE HCL 25 MG PO CAPS: 25.0000 mg | ORAL_CAPSULE | Freq: Four times a day (QID) | ORAL | Status: DC | PRN

## 2017-01-30 MED ORDER — SCOPOLAMINE 1 MG/3DAYS TD PT72
MEDICATED_PATCH | TRANSDERMAL | Status: AC
  Filled 2017-01-30: qty 1

## 2017-01-30 MED ORDER — LACTATED RINGERS IV SOLN
INTRAVENOUS | Status: DC
  Administered 2017-01-30: 06:00:00 via INTRAVENOUS

## 2017-01-30 MED ORDER — COCONUT OIL OIL: 1.0000 "application " | TOPICAL_OIL | Status: DC | PRN

## 2017-01-30 MED ORDER — DIPHENHYDRAMINE HCL 50 MG/ML IJ SOLN: 12.5000 mg | INTRAMUSCULAR | Status: DC | PRN

## 2017-01-30 MED ORDER — KETOROLAC TROMETHAMINE 30 MG/ML IJ SOLN: 30.0000 mg | Freq: Four times a day (QID) | INTRAMUSCULAR | Status: AC | PRN

## 2017-01-30 MED ORDER — NALBUPHINE HCL 10 MG/ML IJ SOLN: 5.0000 mg | INTRAMUSCULAR | Status: DC | PRN

## 2017-01-30 MED ORDER — LACTATED RINGERS IV SOLN
INTRAVENOUS | Status: DC | PRN
  Administered 2017-01-30 (×2): via INTRAVENOUS

## 2017-01-30 MED ORDER — OXYCODONE HCL 5 MG PO TABS: 10.0000 mg | ORAL_TABLET | ORAL | Status: DC | PRN

## 2017-01-30 MED ORDER — TETANUS-DIPHTH-ACELL PERTUSSIS 5-2.5-18.5 LF-MCG/0.5 IM SUSP: 0.5000 mL | Freq: Once | INTRAMUSCULAR | Status: DC

## 2017-01-30 MED ORDER — PHENYLEPHRINE 40 MCG/ML (10ML) SYRINGE FOR IV PUSH (FOR BLOOD PRESSURE SUPPORT)
PREFILLED_SYRINGE | INTRAVENOUS | Status: AC
  Filled 2017-01-30: qty 10

## 2017-01-30 MED ORDER — DIPHENHYDRAMINE HCL 25 MG PO CAPS: 25.0000 mg | ORAL_CAPSULE | ORAL | Status: DC | PRN

## 2017-01-30 MED ORDER — KETOROLAC TROMETHAMINE 30 MG/ML IJ SOLN
INTRAMUSCULAR | Status: AC
  Administered 2017-01-30: 30 mg via INTRAMUSCULAR
  Filled 2017-01-30: qty 1

## 2017-01-30 MED ORDER — SIMETHICONE 80 MG PO CHEW: 80.0000 mg | CHEWABLE_TABLET | ORAL | Status: DC | PRN

## 2017-01-30 MED ORDER — SODIUM CHLORIDE 0.9 % IR SOLN
Status: DC | PRN
  Administered 2017-01-30: 1000 mL

## 2017-01-30 MED ORDER — PENICILLIN G POT IN DEXTROSE 60000 UNIT/ML IV SOLN
3.0000 10*6.[IU] | INTRAVENOUS | Status: DC
  Administered 2017-01-30: 3 10*6.[IU] via INTRAVENOUS
  Filled 2017-01-30 (×5): qty 50

## 2017-01-30 MED ORDER — LACTATED RINGERS IV SOLN
INTRAVENOUS | Status: DC
  Administered 2017-01-30: 125 mL/h via INTRAVENOUS
  Administered 2017-01-31: 04:00:00 via INTRAVENOUS

## 2017-01-30 MED ORDER — PHENYLEPHRINE HCL 10 MG/ML IJ SOLN
INTRAMUSCULAR | Status: DC | PRN
  Administered 2017-01-30 (×3): 80 ug via INTRAVENOUS

## 2017-01-30 MED ORDER — FLEET ENEMA 7-19 GM/118ML RE ENEM: 1.0000 | ENEMA | RECTAL | Status: DC | PRN

## 2017-01-30 MED ORDER — OXYCODONE-ACETAMINOPHEN 5-325 MG PO TABS: 1.0000 | ORAL_TABLET | ORAL | Status: DC | PRN

## 2017-01-30 MED ORDER — DEXTROSE 5 % IV SOLN
5.0000 10*6.[IU] | Freq: Once | INTRAVENOUS | Status: AC
  Administered 2017-01-30: 5 10*6.[IU] via INTRAVENOUS
  Filled 2017-01-30: qty 5

## 2017-01-30 MED ORDER — FENTANYL CITRATE (PF) 100 MCG/2ML IJ SOLN
INTRAMUSCULAR | Status: AC
  Administered 2017-01-30: 25 ug via INTRAVENOUS
  Filled 2017-01-30: qty 2

## 2017-01-30 MED ORDER — ZOLPIDEM TARTRATE 5 MG PO TABS: 5.0000 mg | ORAL_TABLET | Freq: Every evening | ORAL | Status: DC | PRN

## 2017-01-30 MED ORDER — NALBUPHINE HCL 10 MG/ML IJ SOLN
5.0000 mg | INTRAMUSCULAR | Status: DC | PRN
  Administered 2017-01-30: 5 mg via INTRAVENOUS
  Filled 2017-01-30: qty 1

## 2017-01-30 MED ORDER — MORPHINE SULFATE (PF) 0.5 MG/ML IJ SOLN
INTRAMUSCULAR | Status: AC
  Filled 2017-01-30: qty 10

## 2017-01-30 MED ORDER — EPHEDRINE 5 MG/ML INJ: 10.0000 mg | INTRAVENOUS | Status: DC | PRN

## 2017-01-30 MED ORDER — OXYTOCIN 10 UNIT/ML IJ SOLN
INTRAVENOUS | Status: DC | PRN
  Administered 2017-01-30: 40 [IU] via INTRAVENOUS

## 2017-01-30 MED ORDER — WITCH HAZEL-GLYCERIN EX PADS: 1.0000 "application " | MEDICATED_PAD | CUTANEOUS | Status: DC | PRN

## 2017-01-30 MED ORDER — LIDOCAINE HCL (PF) 1 % IJ SOLN: 30.0000 mL | INTRAMUSCULAR | Status: DC | PRN

## 2017-01-30 MED ORDER — ACETAMINOPHEN 325 MG PO TABS: 650.0000 mg | ORAL_TABLET | ORAL | Status: DC | PRN

## 2017-01-30 MED ORDER — SODIUM BICARBONATE 8.4 % IV SOLN
INTRAVENOUS | Status: DC | PRN
  Administered 2017-01-30 (×2): 5 mL via EPIDURAL
  Administered 2017-01-30: 3 mL via EPIDURAL

## 2017-01-30 MED ORDER — CEFAZOLIN SODIUM-DEXTROSE 2-4 GM/100ML-% IV SOLN
INTRAVENOUS | Status: AC
  Filled 2017-01-30: qty 100

## 2017-01-30 MED ORDER — SCOPOLAMINE 1 MG/3DAYS TD PT72
MEDICATED_PATCH | TRANSDERMAL | Status: DC | PRN
  Administered 2017-01-30: 1 via TRANSDERMAL

## 2017-01-30 MED ORDER — KETOROLAC TROMETHAMINE 30 MG/ML IJ SOLN
30.0000 mg | Freq: Four times a day (QID) | INTRAMUSCULAR | Status: AC | PRN
  Administered 2017-01-30: 30 mg via INTRAMUSCULAR

## 2017-01-30 MED ORDER — ACETAMINOPHEN 325 MG PO TABS
650.0000 mg | ORAL_TABLET | ORAL | Status: DC | PRN
  Filled 2017-01-30: qty 2

## 2017-01-30 MED ORDER — LACTATED RINGERS IV SOLN: 500.0000 mL | Freq: Once | INTRAVENOUS | Status: DC

## 2017-01-30 MED ORDER — ONDANSETRON HCL 4 MG/2ML IJ SOLN
INTRAMUSCULAR | Status: DC | PRN
  Administered 2017-01-30: 4 mg via INTRAVENOUS

## 2017-01-30 MED ORDER — SOD CITRATE-CITRIC ACID 500-334 MG/5ML PO SOLN
30.0000 mL | ORAL | Status: DC | PRN
  Administered 2017-01-30: 30 mL via ORAL
  Filled 2017-01-30: qty 15

## 2017-01-30 MED ORDER — ONDANSETRON HCL 4 MG/2ML IJ SOLN
INTRAMUSCULAR | Status: AC
  Filled 2017-01-30: qty 2

## 2017-01-30 MED ORDER — SIMETHICONE 80 MG PO CHEW
80.0000 mg | CHEWABLE_TABLET | Freq: Three times a day (TID) | ORAL | Status: DC
  Administered 2017-01-31 – 2017-02-01 (×5): 80 mg via ORAL
  Filled 2017-01-30 (×5): qty 1

## 2017-01-30 MED ORDER — DIBUCAINE 1 % RE OINT: 1.0000 "application " | TOPICAL_OINTMENT | RECTAL | Status: DC | PRN

## 2017-01-30 MED ORDER — OXYCODONE HCL 5 MG PO TABS
5.0000 mg | ORAL_TABLET | ORAL | Status: DC | PRN
  Administered 2017-01-31 – 2017-02-01 (×2): 5 mg via ORAL
  Filled 2017-01-30 (×2): qty 1

## 2017-01-30 MED ORDER — LACTATED RINGERS IV SOLN
500.0000 mL | INTRAVENOUS | Status: DC | PRN
  Administered 2017-01-30: 1000 mL via INTRAVENOUS

## 2017-01-30 MED ORDER — SODIUM CHLORIDE 0.9% FLUSH: 3.0000 mL | INTRAVENOUS | Status: DC | PRN

## 2017-01-30 MED ORDER — SENNOSIDES-DOCUSATE SODIUM 8.6-50 MG PO TABS
2.0000 | ORAL_TABLET | ORAL | Status: DC
  Administered 2017-01-31 (×2): 2 via ORAL
  Filled 2017-01-30 (×2): qty 2

## 2017-01-30 MED ORDER — PHENYLEPHRINE 40 MCG/ML (10ML) SYRINGE FOR IV PUSH (FOR BLOOD PRESSURE SUPPORT): 80.0000 ug | PREFILLED_SYRINGE | INTRAVENOUS | Status: DC | PRN

## 2017-01-30 MED ORDER — OXYTOCIN BOLUS FROM INFUSION: 500.0000 mL | Freq: Once | INTRAVENOUS | Status: DC

## 2017-01-30 MED ORDER — OXYTOCIN 40 UNITS IN LACTATED RINGERS INFUSION - SIMPLE MED: 2.5000 [IU]/h | INTRAVENOUS | Status: AC

## 2017-01-30 MED ORDER — DEXAMETHASONE SODIUM PHOSPHATE 10 MG/ML IJ SOLN
INTRAMUSCULAR | Status: DC | PRN
  Administered 2017-01-30: 4 mg via INTRAVENOUS

## 2017-01-30 MED ORDER — NALBUPHINE HCL 10 MG/ML IJ SOLN: 5.0000 mg | Freq: Once | INTRAMUSCULAR | Status: DC | PRN

## 2017-01-30 MED ORDER — FENTANYL 2.5 MCG/ML BUPIVACAINE 1/10 % EPIDURAL INFUSION (WH - ANES)
14.0000 mL/h | INTRAMUSCULAR | Status: DC | PRN
  Administered 2017-01-30: 14 mL/h via EPIDURAL

## 2017-01-30 MED ORDER — FENTANYL 2.5 MCG/ML BUPIVACAINE 1/10 % EPIDURAL INFUSION (WH - ANES)
INTRAMUSCULAR | Status: AC
  Filled 2017-01-30: qty 100

## 2017-01-30 MED ORDER — LIDOCAINE HCL (PF) 1 % IJ SOLN
INTRAMUSCULAR | Status: DC | PRN
  Administered 2017-01-30 (×2): 4 mL via EPIDURAL

## 2017-01-30 MED ORDER — OXYTOCIN 10 UNIT/ML IJ SOLN
INTRAMUSCULAR | Status: AC
  Filled 2017-01-30: qty 4

## 2017-01-30 MED ORDER — ONDANSETRON HCL 4 MG/2ML IJ SOLN: 4.0000 mg | Freq: Three times a day (TID) | INTRAMUSCULAR | Status: DC | PRN

## 2017-01-30 MED ORDER — FENTANYL CITRATE (PF) 100 MCG/2ML IJ SOLN
100.0000 ug | INTRAMUSCULAR | Status: DC | PRN
  Administered 2017-01-30: 100 ug via INTRAVENOUS
  Filled 2017-01-30: qty 2

## 2017-01-30 MED ORDER — CEFAZOLIN SODIUM-DEXTROSE 2-3 GM-% IV SOLR
INTRAVENOUS | Status: DC | PRN
  Administered 2017-01-30: 2 g via INTRAVENOUS

## 2017-01-30 MED ORDER — SIMETHICONE 80 MG PO CHEW
80.0000 mg | CHEWABLE_TABLET | ORAL | Status: DC
  Administered 2017-01-31 (×2): 80 mg via ORAL
  Filled 2017-01-30 (×2): qty 1

## 2017-01-30 MED ORDER — MORPHINE SULFATE (PF) 0.5 MG/ML IJ SOLN
INTRAMUSCULAR | Status: DC | PRN
  Administered 2017-01-30: 3 mg via EPIDURAL
  Administered 2017-01-30: 2 mg via INTRAVENOUS

## 2017-01-30 MED ORDER — MENTHOL 3 MG MT LOZG: 1.0000 | LOZENGE | OROMUCOSAL | Status: DC | PRN

## 2017-01-30 MED ORDER — IBUPROFEN 600 MG PO TABS
600.0000 mg | ORAL_TABLET | Freq: Four times a day (QID) | ORAL | Status: DC
  Administered 2017-01-31 – 2017-02-01 (×7): 600 mg via ORAL
  Filled 2017-01-30 (×7): qty 1

## 2017-01-30 MED ORDER — FENTANYL CITRATE (PF) 100 MCG/2ML IJ SOLN
25.0000 ug | INTRAMUSCULAR | Status: DC | PRN
  Administered 2017-01-30: 25 ug via INTRAVENOUS

## 2017-01-30 MED ORDER — DEXAMETHASONE SODIUM PHOSPHATE 4 MG/ML IJ SOLN
INTRAMUSCULAR | Status: AC
  Filled 2017-01-30: qty 1

## 2017-01-30 MED ORDER — NALOXONE HCL 2 MG/2ML IJ SOSY: 1.0000 ug/kg/h | PREFILLED_SYRINGE | INTRAMUSCULAR | Status: DC | PRN

## 2017-01-30 MED ORDER — METOCLOPRAMIDE HCL 5 MG/ML IJ SOLN: 10.0000 mg | Freq: Once | INTRAMUSCULAR | Status: DC | PRN

## 2017-01-30 MED ORDER — OXYCODONE-ACETAMINOPHEN 5-325 MG PO TABS: 2.0000 | ORAL_TABLET | ORAL | Status: DC | PRN

## 2017-01-30 MED ORDER — MEPERIDINE HCL 25 MG/ML IJ SOLN: 6.2500 mg | INTRAMUSCULAR | Status: DC | PRN

## 2017-01-30 MED ORDER — PRENATAL MULTIVITAMIN CH
1.0000 | ORAL_TABLET | Freq: Every day | ORAL | Status: DC
  Administered 2017-01-31 – 2017-02-01 (×2): 1 via ORAL
  Filled 2017-01-30 (×2): qty 1

## 2017-01-30 MED ORDER — LACTATED RINGERS IV SOLN
INTRAVENOUS | Status: DC | PRN
  Administered 2017-01-30: 15:00:00 via INTRAVENOUS

## 2017-01-30 MED ORDER — OXYTOCIN 40 UNITS IN LACTATED RINGERS INFUSION - SIMPLE MED: 2.5000 [IU]/h | INTRAVENOUS | Status: DC

## 2017-01-30 MED ORDER — NALOXONE HCL 0.4 MG/ML IJ SOLN: 0.4000 mg | INTRAMUSCULAR | Status: DC | PRN

## 2017-01-30 MED ORDER — ONDANSETRON HCL 4 MG/2ML IJ SOLN: 4.0000 mg | Freq: Four times a day (QID) | INTRAMUSCULAR | Status: DC | PRN

## 2017-01-30 SURGICAL SUPPLY — 28 items
BENZOIN TINCTURE PRP APPL 2/3 (GAUZE/BANDAGES/DRESSINGS) ×3 IMPLANT
CHLORAPREP W/TINT 26ML (MISCELLANEOUS) ×3 IMPLANT
CLAMP CORD UMBIL (MISCELLANEOUS) ×3 IMPLANT
CLOSURE WOUND 1/2 X4 (GAUZE/BANDAGES/DRESSINGS) ×1
DRSG OPSITE POSTOP 4X10 (GAUZE/BANDAGES/DRESSINGS) ×3 IMPLANT
ELECT REM PT RETURN 9FT ADLT (ELECTROSURGICAL) ×3
ELECTRODE REM PT RTRN 9FT ADLT (ELECTROSURGICAL) ×1 IMPLANT
EXTRACTOR VACUUM M CUP 4 TUBE (SUCTIONS) IMPLANT
EXTRACTOR VACUUM M CUP 4' TUBE (SUCTIONS)
GLOVE BIOGEL PI IND STRL 6.5 (GLOVE) ×1 IMPLANT
GLOVE BIOGEL PI IND STRL 7.0 (GLOVE) ×1 IMPLANT
GLOVE BIOGEL PI INDICATOR 6.5 (GLOVE) ×2
GLOVE BIOGEL PI INDICATOR 7.0 (GLOVE) ×2
GLOVE SURG SS PI 6.0 STRL IVOR (GLOVE) ×3 IMPLANT
GOWN STRL REUS W/TWL LRG LVL3 (GOWN DISPOSABLE) ×6 IMPLANT
KIT ABG SYR 3ML LUER SLIP (SYRINGE) IMPLANT
NEEDLE HYPO 25X5/8 SAFETYGLIDE (NEEDLE) IMPLANT
NS IRRIG 1000ML POUR BTL (IV SOLUTION) ×3 IMPLANT
PACK C SECTION WH (CUSTOM PROCEDURE TRAY) ×3 IMPLANT
PAD OB MATERNITY 4.3X12.25 (PERSONAL CARE ITEMS) ×3 IMPLANT
PENCIL SMOKE EVAC W/HOLSTER (ELECTROSURGICAL) ×3 IMPLANT
RTRCTR C-SECT PINK 25CM LRG (MISCELLANEOUS) ×3 IMPLANT
SEPRAFILM MEMBRANE 5X6 (MISCELLANEOUS) IMPLANT
SPONGE LAP 18X18 X RAY DECT (DISPOSABLE) ×3 IMPLANT
STRIP CLOSURE SKIN 1/2X4 (GAUZE/BANDAGES/DRESSINGS) ×2 IMPLANT
SUT VIC AB 0 CT1 36 (SUTURE) ×12 IMPLANT
SUT VIC AB 4-0 KS 27 (SUTURE) ×3 IMPLANT
TOWEL OR 17X24 6PK STRL BLUE (TOWEL DISPOSABLE) ×3 IMPLANT

## 2017-01-30 NOTE — Op Note (Signed)
[name] PROCEDURE DATE: 01/30/2017  PREOPERATIVE DIAGNOSIS: Intrauterine pregnancy at  [redacted]w[redacted]d weeks gestation; malpresentation: frank breech, repeat cesarean section  POSTOPERATIVE DIAGNOSIS: The same  PROCEDURE:     Cesarean Section  SURGEON:  Dr. Catalina Antigua and Dr. Caryl Ada  ASSISTANT: N/A  INDICATIONS: Whitney [name] is a 31 y.o. G3P3003 at [redacted]w[redacted]d who presented with SOL. She planned to have a VBAC but was noted to have frank breech infant. Went for repeat cesarean section secondary to malpresentation.  The risks of cesarean section discussed with the patient included but were not limited to: bleeding which may require transfusion or reoperation; infection which may require antibiotics; injury to bowel, bladder, ureters or other surrounding organs; injury to the fetus; need for additional procedures including hysterectomy in the event of a life-threatening hemorrhage; placental abnormalities wth subsequent pregnancies, incisional problems, thromboembolic phenomenon and other postoperative/anesthesia complications. The patient concurred with the proposed plan, giving informed written consent for the procedure.    FINDINGS:  Viable female infant in frank breech presentation.  Apgars 8 and 9, weight pending.  Clear amniotic fluid.  Intact placenta, three vessel cord.  Normal uterus, fallopian tubes and ovaries bilaterally.  ANESTHESIA: Epidural INTRAVENOUS FLUIDS: 2600 ml ESTIMATED BLOOD LOSS: 700 ml URINE OUTPUT:  100 ml SPECIMENS: Placenta sent to L&D COMPLICATIONS: None immediate  PROCEDURE IN DETAIL:  The patient received intravenous antibiotics and had sequential compression devices applied to her lower extremities while in the preoperative area.  She was then taken to the operating room where anesthesia was induced and was found to be adequate. A foley catheter was placed into her bladder and attached to constant gravity. She was then placed in a dorsal supine position with a  leftward tilt, and prepped and draped in a sterile manner. After an adequate timeout was performed, a Pfannenstiel skin incision was made with scalpel and carried through to the underlying layer of fascia. The fascia was incised in the midline and this incision was extended bilaterally using the Mayo scissors. Kocher clamps were applied to the superior aspect of the fascial incision and the underlying rectus muscles were dissected off bluntly. A similar process was carried out on the inferior aspect of the facial incision. The rectus muscles were separated in the midline bluntly and the peritoneum was entered bluntly. The Alexis self-retaining retractor was introduced into the abdominal cavity. Attention was turned to the lower uterine segment where a bladder flap was created, and a transverse hysterotomy was made with a scalpel and extended bilaterally bluntly. The infant was successfully delivered, and cord was clamped and cut and infant was handed over to awaiting neonatology team. Uterine massage was then administered and the placenta delivered intact with three-vessel cord. The uterus was cleared of clot and debris.  The hysterotomy was closed with 0 Vicryl in a running locked fashion, and an imbricating layer was also placed with a 0 Vicryl. One figure of eight stitch placed for hemostasis. Overall, excellent hemostasis was noted. The pelvis copiously irrigated and cleared of all clot and debris. Hemostasis was confirmed on all surfaces.  The peritoneum and the muscles were reapproximated using 0 vicryl interrupted stitches. The fascia was then closed using 0 Vicryl in a running fashion.  The skin was closed in a subcuticular fashion using 3.0 Vicryl. The patient tolerated the procedure well. Sponge, lap, instrument and needle counts were correct x 2. She was taken to the recovery room in stable condition.   Caryl Ada, DO OB Fellow Faculty Practice,  [facility] 01/30/2017, 3:52  PM

## 2017-01-30 NOTE — Progress Notes (Signed)
5 big sponges 5 small sponges 10 instruments 2 needles MO

## 2017-01-30 NOTE — H&P (Signed)
LABOR AND DELIVERY ADMISSION HISTORY AND PHYSICAL NOTE  Whitney [name] is a 31 y.o. female G13P2002 with IUP at [redacted]w[redacted]d by LMP, and pregnancy complicated by h/o prior C/S (followed by VBAC), and fetal kidney anomaly presenting for SOL.   She reports positive fetal movement. She denies leakage of fluid or vaginal bleeding.  Prenatal History/Complications: PNC at [facility] Complications: - Fetus with L multicystic dysplastic kidney (has had pediatric urology consultation) - H/o C/S (first baby); second baby vacuum-assisted delivery for maternal exhaustion (7lb 5oz)  Past Medical History: Past Medical History:  Diagnosis Date  . Hepatitis B antibody positive   . No pertinent past medical history     Past Surgical History: Past Surgical History:  Procedure Laterality Date  . CESAREAN SECTION      Obstetrical History: OB History    Gravida Para Term Preterm AB Living   3 2 2     2    SAB TAB Ectopic Multiple Live Births           2      Social History: Social History   Social History  . Marital status: Married    Spouse name: N/A  . Number of children: N/A  . Years of education: N/A   Social History Main Topics  . Smoking status: Never Smoker  . Smokeless tobacco: Never Used  . Alcohol use Yes     Comment: rare before pregnancy  . Drug use: No  . Sexual activity: Yes    Birth control/ protection: None   Other Topics Concern  . None   Social History Narrative  . None    Family History: Family History  Problem Relation Age of Onset  . Diabetes Maternal Grandmother   . Diabetes Maternal Grandfather   . Hypertension Paternal Grandmother   . Hypertension Paternal Grandfather     Allergies: No Known Allergies  Facility-Administered Medications Prior to Admission  Medication Dose Route Frequency Provider Last Rate Last Dose  . Tdap (BOOSTRIX) injection 0.5 mL  0.5 mL Intramuscular Once [place], [place], CNM       Prescriptions Prior to Admission  Medication Sig  Dispense Refill Last Dose  . acetaminophen (TYLENOL) 500 MG tablet Take 500 mg by mouth every 6 (six) hours as needed.   Not Taking  . fexofenadine (ALLEGRA) 30 MG tablet Take 30 mg by mouth as needed.   Taking  . fluticasone (FLONASE) 50 MCG/ACT nasal spray Place 2 sprays into both nostrils at bedtime. 16 g 0 Taking  . Prenatal Multivit-Min-Fe-FA (PRENATAL VITAMINS PO) Take 1 tablet by mouth daily.   Taking     Review of Systems  All systems reviewed and negative except as stated in HPI  Physical Exam Height 5\' 4"  (1.626 m), weight 195 lb (88.5 kg), last menstrual period 04/26/2016, unknown if currently breastfeeding. General appearance: alert, cooperative and no distress Lungs: Normal WOB; no respiratory distress Heart: normal rate Abdomen: soft, non-tender; gravid Extremities: No calf swelling or tenderness Presentation: cephalic Fetal monitoring: baseline rate 140, moderate variability, +acel, no decel Uterine activity: ctx q1-3 min Dilation: 4.5 Effacement (%): 80 Station: -3 Exam by:: Katheren Shams, RN    Prenatal labs: ABO, Rh: B/POS/-- (01/15 0921) Antibody: NEG (01/15 0921) Rubella: !Error! RPR: Non Reactive (05/30 0836)  HBsAg: NEGATIVE (01/15 0921)  HIV: NONREACTIVE (01/15 0921)  GBS:  positive GTT: normal (74, 98, 103) Genetic screening:  Not done Anatomy US: Left multicystic dysplastic kidney; right kidney - normal (s/p ped urology consult on 01/28/17 --  Plan term delivery with [place] after birth and follow up with Dr. Yetta Flock)   Prenatal Transfer Tool  Maternal Diabetes: No Genetic Screening: not done Maternal Ultrasounds/Referrals: Abnormal:  Findings:   Other: MCDK as above Fetal Ultrasounds or other Referrals:  Other: referred to ped urology (Plan term delivery with [place] after birth and follow up with Dr. Yetta Flock) Maternal Substance Abuse:  No Significant Maternal Medications:  None Significant Maternal Lab Results: Lab values include: Group B Strep  positive  No results found for this or any previous visit (from the past 24 hour(s)).  Patient Active Problem List   Diagnosis Date Noted  . GBS (group B Streptococcus carrier), +RV culture, currently pregnant 01/22/2017  . 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 Supervision of low-risk pregnancy 06/25/2016  . History of vaginal birth after cesarean 06/25/2016  . Language barrier affecting health care 06/25/2016    Assessment: Whitney [name] is a 31 y.o. G3P2002 at [redacted]w[redacted]d here for SOL.  #Labor: expectant management #Pain: Per patient's request; unsure if she wants epidural #FWB: Cat I #ID:  GBS+, start PCN #MOF: breast #MOC: Paraguard IUD #Circ:  no  Whitney [name] 01/30/2017, 5:34 AM

## 2017-01-30 NOTE — Anesthesia Preprocedure Evaluation (Addendum)
Anesthesia Evaluation  Patient identified by MRN, date of birth, ID band Patient awake    Reviewed: Allergy & Precautions, Patient's Chart, lab work & pertinent test results  Airway Mallampati: II  TM Distance: >3 FB Neck ROM: Full    Dental no notable dental hx. (+) Teeth Intact   Pulmonary neg pulmonary ROS,    Pulmonary exam normal breath sounds clear to auscultation       Cardiovascular Normal cardiovascular exam Rhythm:Regular Rate:Normal     Neuro/Psych negative neurological ROS  negative psych ROS   GI/Hepatic Neg liver ROS, GERD  ,  Endo/Other  Obesity  Renal/GU Renal disease  negative genitourinary   Musculoskeletal negative musculoskeletal ROS (+)   Abdominal (+) + obese,   Peds  Hematology  (+) anemia ,   Anesthesia Other Findings   Reproductive/Obstetrics (+) Pregnancy                             Anesthesia Physical Anesthesia Plan  ASA: II and emergent  Anesthesia Plan: Epidural   Post-op Pain Management:    Induction:   PONV Risk Score and Plan:   Airway Management Planned: Natural Airway  Additional Equipment:   Intra-op Plan:   Post-operative Plan:   Informed Consent: I have reviewed the patients History and Physical, chart, labs and discussed the procedure including the risks, benefits and alternatives for the proposed anesthesia with the patient or authorized representative who has indicated his/her understanding and acceptance.     Plan Discussed with: Anesthesiologist, CRNA and Surgeon  Anesthesia Plan Comments: (Patient for repeat C/Section due to Breech presentation. Will use Epidural for C/section.)       Anesthesia Quick Evaluation

## 2017-01-30 NOTE — Progress Notes (Signed)
On cervical exam at around 1330, fetus appeared to be breech.  U/S confirmed breech presentation.  Patient was consented for LTCS and will be taken to the OR shortly.

## 2017-01-30 NOTE — Anesthesia Postprocedure Evaluation (Signed)
Anesthesia Post Note  Patient: Whitney [name]  Procedure(s) Performed: Procedure(s) (LRB): REPEAT CESAREAN SECTION (N/A)     Patient location during evaluation: PACU Anesthesia Type: Epidural and General Level of consciousness: awake and alert and oriented Pain management: pain level controlled Vital Signs Assessment: post-procedure vital signs reviewed and stable Respiratory status: spontaneous breathing, nonlabored ventilation and respiratory function stable Cardiovascular status: blood pressure returned to baseline and stable Postop Assessment: no signs of nausea or vomiting, patient able to bend at knees, epidural receding, no backache and no headache Anesthetic complications: no    Last Vitals:  Vitals:   01/30/17 1630 01/30/17 1644  BP: 98/66   Pulse: 62   Resp: 18   Temp:  36.5 C  SpO2: 100%     Last Pain:  Vitals:   01/30/17 1645  TempSrc:   PainSc: 0-No pain   Pain Goal:                 Brode Sculley A.

## 2017-01-30 NOTE — Progress Notes (Signed)
Labor Progress Note  S: Patient seen & examined for progress of labor. Patient comfortable with epidural.  SROM occurred at around 1125.     O: BP 120/70   Pulse 75   Temp 98 F (36.7 C) (Oral)   Resp 16   Ht 5\' 4"  (1.626 m)   Wt 88.5 kg (195 lb)   LMP 04/26/2016   BMI 33.47 kg/m   FHT: 130bpm, mod var, +accels, no decels TOCO: q2-62min, patient looks comfortable during contractions  CVE: Dilation: 7 Effacement (%): 90 Cervical Position: Middle Station: -1 Presentation: Vertex Exam by:: Resident  A&P: 31 y.o. [id number] [redacted]w[redacted]d here for SOL.  Currently without augmentation of labor. Continue PCN for GBS pos status. Continue expectant management. Anticipate SVD.  Lezlie Octave, MD FM Resident PGY-1 01/30/2017 11:31 AM

## 2017-01-30 NOTE — Anesthesia Pain Management Evaluation Note (Signed)
  CRNA Pain Management Visit Note  Patient: Whitney [name], 31 y.o., female  "Hello I am a member of the anesthesia team at [facility]. We have an anesthesia team available at all times to provide care throughout the hospital, including epidural management and anesthesia for C-section. I don't know your plan for the delivery whether it a natural birth, water birth, IV sedation, nitrous supplementation, doula or epidural, but we want to meet your pain goals."   1.Was your pain managed to your expectations on prior hospitalizations?   Yes   2.What is your expectation for pain management during this hospitalization?     IV pain meds  3.How can we help you reach that goal? L&D RN in room, preparing to administer IV pain meds. Patient is aware of all pain control options.  Record the patient's initial score and the patient's pain goal.   Pain: 10  Pain Goal: 3 [facility] wants you to be able to say your pain was always managed very well.  Maiko Salais L 01/30/2017

## 2017-01-30 NOTE — Addendum Note (Signed)
Addendum  created 01/30/17 1823 by Elgie Congo, CRNA   Charge Capture section accepted, Sign clinical note

## 2017-01-30 NOTE — Anesthesia Postprocedure Evaluation (Signed)
Anesthesia Post Note  Patient: Whitney [name]  Procedure(s) Performed: Procedure(s) (LRB): REPEAT CESAREAN SECTION (N/A)     Patient location during evaluation: Mother Baby Anesthesia Type: Epidural Level of consciousness: awake and alert and oriented Pain management: pain level controlled Vital Signs Assessment: post-procedure vital signs reviewed and stable Respiratory status: spontaneous breathing and nonlabored ventilation Cardiovascular status: stable Postop Assessment: no headache, patient able to bend at knees, no backache, no signs of nausea or vomiting, epidural receding and adequate PO intake Anesthetic complications: no    Last Vitals:  Vitals:   01/30/17 1700 01/30/17 1755  BP: 95/82 (!) 109/57  Pulse: 62 (!) 59  Resp: 16 18  Temp: 36.9 C 36.9 C  SpO2: 97% 98%    Last Pain:  Vitals:   01/30/17 1759  TempSrc:   PainSc: 2    Pain Goal:                 Laban Emperor

## 2017-01-30 NOTE — Progress Notes (Signed)
Labor Progress Note  Whitney [name] is a 31 y.o. G3P2002 at [redacted]w[redacted]d  admitted for active labor and TOLAC  S: Received call to room to check patient due to concern for malpresentation.   O:  BP 120/70   Pulse 75   Temp 98.3 F (36.8 C) (Oral)   Resp 16   Ht 5\' 4"  (1.626 m)   Wt 195 lb (88.5 kg)   LMP 04/26/2016   BMI 33.47 kg/m   No intake/output data recorded.  FHT:  FHR: 140 bpm, variability: moderate,  accelerations:  Present,  decelerations:  Absent UC:   regular, every 2-4 minutes SVE:   Dilation: 8.5 Effacement (%): 90 Station: 0 Exam by:: Dr Jolayne Panther  Beside [place] with frank breech presentation  Labs: Lab Results  Component Value Date   WBC 9.7 01/30/2017   HGB 10.1 (L) 01/30/2017   HCT 30.3 (L) 01/30/2017   MCV 79.3 01/30/2017   PLT 220 01/30/2017    Assessment / Plan: 31 y.o. G3P2002 [redacted]w[redacted]d in active labor. Baby is breech and confirmed on [place]. Will prep patient for OR. The risks of cesarean section discussed with the patient included but were not limited to: bleeding which may require transfusion or reoperation; infection which may require antibiotics; injury to bowel, bladder, ureters or other surrounding organs; injury to the fetus; need for additional procedures including hysterectomy in the event of a life-threatening hemorrhage; placental abnormalities wth subsequent pregnancies, incisional problems, thromboembolic phenomenon and other postoperative/anesthesia complications. The patient concurred with the proposed plan, giving informed written consent for the procedure. Patient has been NPO since last night she will remain NPO for procedure. Anesthesia and OR aware. Preoperative prophylactic antibiotics and SCDs ordered on call to the OR. To OR when ready.   Caryl Ada, DO OB Fellow Faculty Practice, [facility] 01/30/2017, 2:26 PM

## 2017-01-30 NOTE — MAU Note (Signed)
Pt here with c/o contractions. Was 2cm on last exam.

## 2017-01-30 NOTE — Transfer of Care (Signed)
Immediate Anesthesia Transfer of Care Note  Patient: Whitney [name]  Procedure(s) Performed: Procedure(s): REPEAT CESAREAN SECTION (N/A)  Patient Location: PACU  Anesthesia Type:Epidural  Level of Consciousness: awake, alert  and oriented  Airway & Oxygen Therapy: Patient Spontanous Breathing  Post-op Assessment: Report given to RN and Post -op Vital signs reviewed and stable  Post vital signs: Reviewed and stable  Last Vitals:  Vitals:   01/30/17 1330 01/30/17 1400  BP:    Pulse:    Resp: 16 16  Temp:      Last Pain:  Vitals:   01/30/17 1200  TempSrc: Oral  PainSc:          Complications: No apparent anesthesia complications

## 2017-01-30 NOTE — Anesthesia Procedure Notes (Signed)
Epidural Patient location during procedure: OB Start time: 01/30/2017 10:07 AM  Staffing Anesthesiologist: Mal Amabile Performed: anesthesiologist   Preanesthetic Checklist Completed: patient identified, site marked, surgical consent, pre-op evaluation, timeout performed, IV checked, risks and benefits discussed and monitors and equipment checked  Epidural Patient position: sitting Prep: site prepped and draped and DuraPrep Patient monitoring: continuous pulse ox and blood pressure Approach: midline Location: L3-L4 Injection technique: LOR air  Needle:  Needle type: Tuohy  Needle gauge: 17 G Needle length: 9 cm and 9 Needle insertion depth: 6 cm Catheter type: closed end flexible Catheter size: 19 Gauge Catheter at skin depth: 11 cm Test dose: negative and Other  Assessment Events: blood not aspirated, injection not painful, no injection resistance, negative IV test and no paresthesia  Additional Notes Patient identified. Risks and benefits discussed including failed block, incomplete  Pain control, post dural puncture headache, nerve damage, paralysis, blood pressure Changes, nausea, vomiting, reactions to medications-both toxic and allergic and post Partum back pain. All questions were answered. Patient expressed understanding and wished to proceed. Sterile technique was used throughout procedure. Epidural site was Dressed with sterile barrier dressing. No paresthesias, signs of intravascular injection Or signs of intrathecal spread were encountered.  Patient was more comfortable after the epidural was dosed. Please see RN's note for documentation of vital signs and FHR which are stable.

## 2017-01-31 LAB — CBC
HCT: 23.5 % — ABNORMAL LOW (ref 36.0–46.0)
Hemoglobin: 7.9 g/dL — ABNORMAL LOW (ref 12.0–15.0)
MCH: 26.9 pg (ref 26.0–34.0)
MCHC: 33.6 g/dL (ref 30.0–36.0)
MCV: 79.9 fL (ref 78.0–100.0)
Platelets: 174 10*3/uL (ref 150–400)
RBC: 2.94 MIL/uL — ABNORMAL LOW (ref 3.87–5.11)
RDW: 14.9 % (ref 11.5–15.5)
WBC: 13.8 10*3/uL — AB (ref 4.0–10.5)

## 2017-01-31 NOTE — Progress Notes (Signed)
POSTPARTUM PROGRESS NOTE  Post Partum Day 1/POD#1  Subjective:  Whitney [name] is a 31 y.o. G3P3003 s/p RLTCS at [redacted]w[redacted]d.  No acute events overnight.  Pt denies problems with ambulating, voiding or po intake.  She denies nausea or vomiting.  Pain is well controlled.  She has had flatus. She has not had bowel movement.  Lochia Moderate.   Objective: Blood pressure (!) 91/45, pulse 61, temperature 98.7 F (37.1 C), temperature source Oral, resp. rate 16, height 5\' 4"  (1.626 m), weight 195 lb (88.5 kg), last menstrual period 04/26/2016, SpO2 97 %, unknown if currently breastfeeding.  Physical Exam:  General: alert, cooperative and no distress Chest: no respiratory distress Heart:regular rate, distal pulses intact Abdomen: soft, nontender,  Uterine Fundus: firm, appropriately tender DVT Evaluation: No calf swelling or tenderness Extremities: no edema Skin: warm, dry; dressing with small amount of blood through   Recent Labs  01/30/17 0535 01/31/17 0537  HGB 10.1* 7.9*  HCT 30.3* 23.5*    Assessment/Plan: Whitney [name] is a 31 y.o. G3P3003 s/p LTCS at [redacted]w[redacted]d   P0D#1 - Doing well. Routine caure Contraception: Paraguard Feeding: breast Dispo: Plan for discharge POD#2/3.   LOS: 1 day   Kandra Nicolas DegeleMD 01/31/2017, 9:04 AM

## 2017-02-01 DIAGNOSIS — Z98891 History of uterine scar from previous surgery: Secondary | ICD-10-CM

## 2017-02-01 MED ORDER — ACETAMINOPHEN 325 MG PO TABS: 650.0000 mg | ORAL_TABLET | ORAL | 0 refills | Status: AC | PRN

## 2017-02-01 MED ORDER — OXYCODONE HCL 5 MG PO TABS: 5.0000 mg | ORAL_TABLET | ORAL | 0 refills | Status: AC | PRN

## 2017-02-01 MED ORDER — FERROUS SULFATE 325 (65 FE) MG PO TABS: 325.0000 mg | ORAL_TABLET | Freq: Every day | ORAL | 3 refills | Status: AC

## 2017-02-01 MED ORDER — IBUPROFEN 600 MG PO TABS: 600.0000 mg | ORAL_TABLET | Freq: Four times a day (QID) | ORAL | 0 refills | Status: DC

## 2017-02-01 NOTE — Discharge Summary (Signed)
OB Discharge Summary     Patient Name: Whitney [name] DOB: Nov 23, 1985 MRN: [id number]  Date of admission: 01/30/2017 Delivering MD: Pincus Large   Date of discharge: 02/01/2017  Admitting diagnosis: 39 WEEKS CTX  Intrauterine pregnancy: [redacted]w[redacted]d     Secondary diagnosis:  Principal Problem:   Status post repeat low transverse cesarean section Active Problems:   GBS (group B Streptococcus carrier), +RV culture, currently pregnant  Additional problems: breech presentation that resulted in LTCS     Discharge diagnosis: Term Pregnancy Delivered                                                                                                Post partum procedures:none  Augmentation: none  Complications: None  Hospital course:  Onset of Labor With Unplanned C/S  31 y.o. yo G3P3003 at [redacted]w[redacted]d was admitted in Latent Labor on 01/30/2017. Membrane Rupture Time/Date: 11:25 AM ,01/30/2017   The patient went for cesarean section due to Malpresentation, and delivered a Viable infant,01/30/2017  Details of operation can be found in separate operative note. Patient had an uncomplicated postpartum course.  She is ambulating,tolerating a regular diet, passing flatus, and urinating well.  Patient is discharged home in stable condition 02/01/17.  Physical exam  Vitals:   01/31/17 0625 01/31/17 1028 01/31/17 1500 02/01/17 0600  BP: (!) 91/45 (!) 88/43 (!) 98/57 (!) 105/52  Pulse:  70 77 62  Resp:  18 18 18   Temp:  98.2 F (36.8 C) 98.5 F (36.9 C) 98.7 F (37.1 C)  TempSrc:  Axillary Axillary Oral  SpO2:      Weight:      Height:       General: alert, cooperative and no distress Lochia: appropriate Uterine Fundus: firm Incision: Healing well with no significant drainage, No significant erythema, Dressing is clean, dry, and intact DVT Evaluation: No evidence of DVT seen on physical exam. Negative Homan's sign. No cords or calf tenderness. No significant calf/ankle edema. Labs: Lab Results   Component Value Date   WBC 13.8 (H) 01/31/2017   HGB 7.9 (L) 01/31/2017   HCT 23.5 (L) 01/31/2017   MCV 79.9 01/31/2017   PLT 174 01/31/2017   No flowsheet data found.  Discharge instruction: per After Visit Summary and "Baby and Me Booklet".  After visit meds:  Allergies as of 02/01/2017   No Known Allergies     Medication List    TAKE these medications   acetaminophen 325 MG tablet Commonly known as:  TYLENOL Take 2 tablets (650 mg total) by mouth every 4 (four) hours as needed (for pain scale < 4).   ferrous sulfate 325 (65 FE) MG tablet Take 1 tablet (325 mg total) by mouth daily.   fexofenadine 30 MG tablet Commonly known as:  ALLEGRA Take 30 mg by mouth as needed.   fluticasone 50 MCG/ACT nasal spray Commonly known as:  FLONASE Place 2 sprays into both nostrils at bedtime.   ibuprofen 600 MG tablet Commonly known as:  ADVIL,MOTRIN Take 1 tablet (600 mg total) by mouth every 6 (six) hours.   oxyCODONE 5  MG immediate release tablet Commonly known as:  Oxy IR/ROXICODONE Take 1 tablet (5 mg total) by mouth every 4 (four) hours as needed (pain scale 4-7).   PRENATAL VITAMINS PO Take 1 tablet by mouth daily.            Discharge Care Instructions        Start     Ordered   02/01/17 0000  acetaminophen (TYLENOL) 325 MG tablet  Every 4 hours PRN     02/01/17 0945   02/01/17 0000  ibuprofen (ADVIL,MOTRIN) 600 MG tablet  Every 6 hours     02/01/17 0945   02/01/17 0000  oxyCODONE (OXY IR/ROXICODONE) 5 MG immediate release tablet  Every 4 hours PRN     02/01/17 0945   02/01/17 0000  ferrous sulfate 325 (65 FE) MG tablet  Daily     02/01/17 0950   01/30/17 0000  OB RESULT CONSOLE Group B Strep    Comments:  This external order was created through the Results Console.   01/30/17 0559   01/30/17 0000  OB RESULTS CONSOLE GC/Chlamydia    Comments:  This external order was created through the Results Console.   01/30/17 0559      Diet: routine  diet  Activity: Advance as tolerated. Pelvic rest for 6 weeks.   Outpatient follow up:2 weeks Follow up Appt: Future Appointments Date Time Provider Department Center  03/13/2017 10:20 AM Dorathy Kinsman, [place] [facility]   Follow up Visit:No Follow-up on file.  Postpartum contraception: IUD Paragard  Newborn Data: Live born female  Birth Weight: 7 lb 13.4 oz (3555 g) APGAR: 8, 9  Baby Feeding: Bottle and Breast Disposition:home with mother  02/01/2017 Lennox Solders, MD  OB FELLOW DISCHARGE ATTESTATION  I have seen and examined this patient and agree with above documentation in the resident's note.   Frederik Pear, MD OB Fellow 10:52 AM

## 2017-03-13 ENCOUNTER — Ambulatory Visit (INDEPENDENT_AMBULATORY_CARE_PROVIDER_SITE_OTHER): Payer: Self-pay | Admitting: Advanced Practice Midwife

## 2017-03-13 ENCOUNTER — Encounter: Payer: Self-pay | Admitting: Advanced Practice Midwife

## 2017-03-13 NOTE — Patient Instructions (Signed)
Colocacin de un dispositivo intrauterino (Intrauterine Device Insertion) El dispositivo intrauterino (DIU) se inserta en el tero para evitar el embarazo. Existen dos tipos de DIU:  DIU de cobre: este tipo de DIU crea un ambiente desfavorable para la supervivencia de los espermatozoides. El mecanismo de accin no se conoce con certeza. Puede permanecer colocado durante 10 aos.  DIU hormonal: este tipo de DIU contiene la hormona progestina (progesterona sinttica). La progestina espesa el moco cervical y evita que los espermatozoides ingresen al tero y tambin afina la membrana que tapiza interiormente al tero. No hay evidencias de que el DIU hormonal evite la implantacin. Un DIU hormonal puede dejarse colocado durante un mximo de 5aos, y un DIU hormonal diferente, durante un mximo de 3aos. Es el mtodo anticonceptivo de mejor relacin entre el costo y la efectividad si se deja colocado todo el tiempo de su duracin. Se puede retirar en cualquier momento. INFORME A SU MDICO:  Cualquier alergia que tenga.  Todos los medicamentos que utiliza, incluyendo vitaminas, hierbas, gotas oftlmicas, cremas y medicamentos de venta libre.  Problemas previos que usted o los miembros de su familia hayan tenido con el uso de anestsicos.  Enfermedades de la sangre.  Cirugas previas.  Posible embarazo.  Enfermedades patolgicas.  RIESGOS Y COMPLICACIONES Generalmente, la colocacin de un dispositivo intrauterino es un procedimiento seguro. Sin embargo, como en cualquier procedimiento, pueden surgir complicaciones. Las complicaciones posibles son:  Pinchazo accidental (perforacin) del tero.  La colocacin accidental del DIU en la capa muscular del tero (miometrio) o fuera del tero. Si esto sucede, el DIU puede quedar flotando alrededor de los intestinos y debe extraerse quirrgicamente.  El DIU puede salirse del tero (expulsin). Esto es ms frecuente en las mujeres que han dado a luz  recientemente.  Embarazo en la trompa de Falopio (ectpico).  Enfermedad plvica inflamatoria (EPI), una infeccin del tero y las trompas de Falopio. Mayor riesgo de tener EPI durante los primeros 20das despus de la colocacin del DIU; sin embargo, el riesgo general sigue siendo muy bajo. ANTES DEL PROCEDIMIENTO  Programe la insercin del DIU para cuando tenga el perodo menstrual o inmediatamente despus, para asegurarse de que no est embarazada. La colocacin del DIU se tolera mejor poco despus del ciclo menstrual.  Podra necesitar anlisis o ser examinada para asegurarse de que no est embarazada. ? Es posible que tenga que hacerse un test de embarazo. ? Podrn indicarle anlisis para descartar enfermedades de transmisin sexual (ETS) antes de la colocacin. Colocar un DIU a una mujer que tiene una infeccin puede hacer que esta empeore.  Podrn indicarle que tome un analgsico 1 o 2 horas antes del procedimiento.  Se realiza un examen para determinar el tamao y la posicin del tero.  Consulte a su mdico si debe cambiar o suspender los medicamentos que toma habitualmente.  PROCEDIMIENTO  Se coloca un instrumento (espculo) en la vagina que le permite a su mdico observar la parte inferior del tero (cuello del tero).  El cuello del tero se prepara con un medicamento que disminuye el riesgo de infeccin.  Tal vez le apliquen un medicamento para adormecer cada lado del cuello del tero (bloqueo intracervical o paracervical). Esto se realiza para evitar y controlar cualquier malestar con la insercin.  Se insertar un instrumento (sonda uterina) en el tero para determinar la longitud de la cavidad uterina y la direccin hacia la cual el tero puede inclinarse.  Se coloca un dispositivo delgado (insertador del DIU) a travs del canal del   cuello del tero hasta el tero.  El DIU se coloca en la cavidad uterina y el dispositivo de insercin se retira.  Se recorta el hilo de  nylon que est unido al DIU y que se utiliza para su extraccin final. Se recorta de forma que quede en la vagina, apenas afuera del cuello uterino.  DESPUS DEL PROCEDIMIENTO  Podr tener sangrado despus del procedimiento. Esto es normal. Vara desde un sangrado ligero durante un par de das hasta un sangrado similar al menstrual.  Podr sentir clicos leves.  Esta informacin no tiene como fin reemplazar el consejo del mdico. Asegrese de hacerle al mdico cualquier pregunta que tenga. Document Released: 02/20/2012 Document Revised: 03/18/2013 Document Reviewed: 11/16/2012 Elsevier Interactive Patient Education  2017 [employer]  

## 2017-03-13 NOTE — Progress Notes (Signed)
Subjective:     Whitney [name] is a 31 y.o. female who presents for a postpartum visit. She is 6 weeks postpartum following a low cervical transverse Cesarean section. I have fully reviewed the prenatal and intrapartum course. The delivery was at 39 gestational weeks. Outcome: primary cesarean section, low transverse incision. Anesthesia: spinal. Postpartum course has been unremarkable. 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 Baby is feeding by both breast and bottle - Similac Advance. Bleeding staining only. Bowel function is normal. Bladder function is normal. Patient is not sexually active. Contraception method is none. Postpartum depression screening: negative. Spanish video interpreter "Whitney [name]" [phone] used. Due 2021 The following portions of the patient's history were reviewed and updated as appropriate: allergies, current medications, past family history, past medical history, past social history, past surgical history and problem list.  Review of Systems Pertinent items are noted in HPI.   Objective:    BP 122/69   Pulse 67   Wt 175 lb 9.6 oz (79.7 kg)   Breastfeeding? Yes   BMI 30.14 kg/m   General:  alert, cooperative, appears stated age, no distress and mildly obese   Breasts:  declined  Lungs: clear to auscultation bilaterally  Heart:  regular rate and rhythm, S1, S2 normal, no murmur, click, rub or gallop  Abdomen: soft, non-tender; bowel sounds normal; no masses,  no organomegaly and C/S incision healing well   Vulva:  not evaluated  Vagina: not evaluated   Assessment:     Nml postpartum exam. Pap smear not done at today's visit.       Plan:    1. Contraception: Paragard. [facility]. Declined other BC for now. Recommend abstinence until then.  2. Pap Due 2021. 3. Follow up in: 1 year for gyn exam or as needed.

## 2018-06-11 DIAGNOSIS — H3562 Retinal hemorrhage, left eye: Secondary | ICD-10-CM

## 2018-06-11 HISTORY — DX: Retinal hemorrhage, left eye: H35.62

## 2018-11-10 ENCOUNTER — Ambulatory Visit (INDEPENDENT_AMBULATORY_CARE_PROVIDER_SITE_OTHER): Payer: Self-pay | Admitting: Ophthalmology

## 2018-11-10 ENCOUNTER — Encounter (INDEPENDENT_AMBULATORY_CARE_PROVIDER_SITE_OTHER): Payer: Self-pay | Admitting: Ophthalmology

## 2018-11-10 ENCOUNTER — Other Ambulatory Visit: Payer: Self-pay

## 2018-11-10 DIAGNOSIS — H442A2 Degenerative myopia with choroidal neovascularization, left eye: Secondary | ICD-10-CM

## 2018-11-10 DIAGNOSIS — H52203 Unspecified astigmatism, bilateral: Secondary | ICD-10-CM

## 2018-11-10 DIAGNOSIS — H3581 Retinal edema: Secondary | ICD-10-CM

## 2018-11-10 DIAGNOSIS — H5213 Myopia, bilateral: Secondary | ICD-10-CM

## 2018-11-10 DIAGNOSIS — H3562 Retinal hemorrhage, left eye: Secondary | ICD-10-CM

## 2018-11-10 MED ORDER — BEVACIZUMAB CHEMO INJECTION 1.25MG/0.05ML SYRINGE FOR KALEIDOSCOPE
1.2500 mg | INTRAVITREAL | Status: AC | PRN
  Administered 2018-11-10: 1.25 mg via INTRAVITREAL

## 2018-11-10 NOTE — Progress Notes (Signed)
[facility] - Clinic Note  11/10/2018     CHIEF COMPLAINT Patient presents for Retina Evaluation   HISTORY OF PRESENT ILLNESS: Whitney [name] is a 33 y.o. female who presents to the clinic today for:   HPI    Retina Evaluation    In left eye.  This started 3 weeks ago.  Duration of 3 weeks.  Associated Symptoms Negative for Flashes, Blind Spot, Photophobia, Scalp Tenderness, Fever, Pain, Glare, Floaters, Jaw Claudication, Weight Loss, Distortion, Redness, Trauma, Shoulder/Hip pain and Fatigue.  Context:  distance vision, mid-range vision and near vision.  Treatments tried include no treatments.  I, the attending physician,  performed the HPI with the patient and updated documentation appropriately.          Comments    Patient referred by Dr. Karleen Hampshire for retinal eval. Patient c/o sudden loss of central vision OS about 3 weeks ago. Spot started as dark, almost black. For the past week, the spot has become lighter, almost gray in color. Denies floaters, FOL. Headache on left side at times since vision problem started. No history of diabetes or hypertension. Patient reports high level of stress when vision problem started.        Last edited by Rennis Chris, MD on 11/10/2018 10:18 AM. (History)    pt states she saw Dr. Karleen Hampshire a week or so ago, pt states her daughter also sees Dr. Karleen Hampshire and that's how she found out about him, pt states about 3 weeks ago suddenly a little spot appeared in her the central vision of her left eye, he states in the beginning the spot was black and she couldn't see anything beyond it, she states now the spot is grey and more translucent, she states she did not have any eye problems as a child, but she has always been very myopic, she states she was dx with myopia at 16 and has been wearing contact lens since she was 22, pt states she does not take any medications on a daily basis  Referring physician: Aura Camps, MD [address] [place], [place] [address]  HISTORICAL INFORMATION:   Selected notes from the MEDICAL RECORD NUMBER Referred by Dr. Aura Camps for concern of maculopathy / central loss of vision OS LEE: 05.22.20 (M. Karleen Hampshire) [BCVA: OD: 20/20, OS: 20/100) Ocular Hx-ametropia (10 yrs ago) PMH-   CURRENT MEDICATIONS: No current outpatient medications on file. (Ophthalmic Drugs)   No current facility-administered medications for this visit.  (Ophthalmic Drugs)   Current Outpatient Medications (Other)  Medication Sig  . acetaminophen (TYLENOL) 325 MG tablet Take 2 tablets (650 mg total) by mouth every 4 (four) hours as needed (for pain scale < 4).  . fexofenadine (ALLEGRA) 30 MG tablet Take 30 mg by mouth as needed.  . fluticasone (FLONASE) 50 MCG/ACT nasal spray Place 2 sprays into both nostrils at bedtime.  Marland Kitchen ibuprofen (ADVIL,MOTRIN) 600 MG tablet Take 1 tablet (600 mg total) by mouth every 6 (six) hours.  . ferrous sulfate 325 (65 FE) MG tablet Take 1 tablet (325 mg total) by mouth daily.  . Prenatal Multivit-Min-Fe-FA (PRENATAL VITAMINS PO) Take 1 tablet by mouth daily.   No current facility-administered medications for this visit.  (Other)      REVIEW OF SYSTEMS: ROS    Positive for: Eyes   Negative for: Constitutional, Gastrointestinal, Neurological, Skin, Genitourinary, Musculoskeletal, HENT, Endocrine, Cardiovascular, Respiratory, Psychiatric, Allergic/Imm, Heme/Lymph   Last edited by Annalee Genta D on 11/10/2018  9:35 AM. (History)       ALLERGIES No Known Allergies  PAST MEDICAL HISTORY Past Medical History:  Diagnosis Date  . Hepatitis B antibody positive   . No pertinent past medical history    Past Surgical History:  Procedure Laterality Date  . CESAREAN SECTION    . CESAREAN SECTION N/A 01/30/2017   Procedure: REPEAT CESAREAN SECTION;  Surgeon: Catalina Antiguaonstant, Peggy, MD;  [facility]: [facility];  Service: Obstetrics;  Laterality: N/A;    FAMILY HISTORY Family  History  Problem Relation Age of Onset  . Diabetes Maternal Grandmother   . Diabetes Maternal Grandfather   . Hypertension Paternal Grandmother   . Hypertension Paternal Grandfather     SOCIAL HISTORY Social History   Tobacco Use  . Smoking status: Never Smoker  . Smokeless tobacco: Never Used  Substance Use Topics  . Alcohol use: Yes    Comment: rare before pregnancy  . Drug use: No         OPHTHALMIC EXAM:  Base Eye Exam    Visual Acuity (Snellen - Linear)      Right Left   Dist [place] 20/25 +2 20/200 -3   Dist ph [place] NI 20/200   Correction:  Contacts       Tonometry (Tonopen, 9:48 AM)      Right Left   Pressure 13 18       Pupils      Dark Light Shape React APD   Right 3 2 Round Brisk None   Left 3 2.5 Round Minimal +2       Visual Fields (Counting fingers)      Left Right    Full Full       Extraocular Movement      Right Left    Full, Ortho Full, Ortho       Neuro/Psych    Oriented x3:  Yes   Mood/Affect:  Normal       Dilation    Both eyes:  1.0% Mydriacyl, 2.5% Phenylephrine @ 9:48 AM        Slit Lamp and Fundus Exam    Slit Lamp Exam      Right Left   Lids/Lashes Mild Meibomian gland dysfunction Mild Meibomian gland dysfunction   Conjunctiva/Sclera Mild Melanosis Mild Melanosis, mild temporal Pinguecula   Cornea Clear Clear   Anterior Chamber Deep and quiet Deep and quiet   Iris Round and dilated Round and dilated   Lens Clear Clear   Vitreous Mild Vitreous syneresis Mild Vitreous syneresis       Fundus Exam      Right Left   Disc mild tilt, mild temporal PPA mild tilt, mild temporal PPA   C/D Ratio 0.5 0.5   Macula Flat, Good foveal reflex, No heme or edema Blunted foveal reflex, +CNV with +sub-retinal heme and exudates   Vessels Mild Vascular attenuation Mild Vascular attenuation   Periphery Attached, No heme  Attached, focal punctate RPE nasal to disc        Refraction    Manifest Refraction      Sphere Cylinder Axis Dist  VA   Right -8.50 +0.50 090 20/30+2   Left -11.50 +0.50 090 20/200          IMAGING AND PROCEDURES  Imaging and Procedures for @TODAY @  OCT, Retina - OU - Both Eyes       Right Eye Quality was good. Central Foveal Thickness: 265. Progression has no prior data. Findings include myopic contour, normal  foveal contour, no SRF, no IRF.   Left Eye Quality was good. Central Foveal Thickness: 314. Progression has no prior data. Findings include myopic contour, intraretinal fluid, pigment epithelial detachment, subretinal fluid, subretinal hyper-reflective material, choroidal neovascular membrane, abnormal foveal contour.   Notes *Images captured and stored on drive  Diagnosis / Impression:  OD: myopic degeneration; no IRF/SRF OS: myopic degeneration with CNV, +IRF/SRF   Clinical management:  See below  Abbreviations: NFP - Normal foveal profile. CME - cystoid macular edema. PED - pigment epithelial detachment. IRF - intraretinal fluid. SRF - subretinal fluid. EZ - ellipsoid zone. ERM - epiretinal membrane. ORA - outer retinal atrophy. ORT - outer retinal tubulation. SRHM - subretinal hyper-reflective material        Intravitreal Injection, Pharmacologic Agent - OS - Left Eye       Time Out 11/10/2018. 12:10 PM. Confirmed correct patient, procedure, site, and patient consented.   Anesthesia Topical anesthesia was used. Anesthetic medications included Lidocaine 2%, Proparacaine 0.5%.   Procedure Preparation included 5% betadine to ocular surface, eyelid speculum. A 30 gauge needle was used.   Injection:  1.25 mg Bevacizumab (AVASTIN) SOLN   NDC: 16109-604-54, Lot: [id number] , Expiration date: 12/31/2018   Route: Intravitreal, Site: Left Eye, Waste: 0 mL  Post-op Post injection exam found visual acuity of at least counting fingers. The patient tolerated the procedure well. There were no complications. The patient received written and verbal post procedure care education.         Fluorescein Angiography Optos (Transit OS)       Right Eye   Progression has no prior data. Early phase findings include normal observations. Mid/Late phase findings include normal observations.   Left Eye   Progression has no prior data. Early phase findings include blockage, staining, leakage, choroidal neovascularization. Mid/Late phase findings include blockage, staining, leakage, choroidal neovascularization.   Notes Images stored on drive;   Impression: OD: normal study OS: central choroidal neovascular membrane with surrounding blockage (heme); +leakage                  ASSESSMENT/PLAN:    ICD-10-CM   1. 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 Subretinal hemorrhage of left eye H35.62 Intravitreal Injection, Pharmacologic Agent - OS - Left Eye    Bevacizumab (AVASTIN) SOLN 1.25 mg    Fluorescein Angiography Optos (Transit OS)  3. Retinal edema H35.81 OCT, Retina - OU - Both Eyes  4. Myopia of both eyes with astigmatism H52.13    H52.203     1-3. 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 High myopia w/ astigmatism OU - discussed association of high myopia with CNV - corrects with contact lenses and spectacles - monitor   Ophthalmic Meds Ordered this visit:  Meds ordered this encounter  Medications  .  Bevacizumab (AVASTIN) SOLN 1.25 mg       Return if  symptoms worsen or fail to improve.  There are no Patient Instructions on file for this visit.   Explained the diagnoses, plan, and follow up with the patient and they expressed understanding.  Patient expressed understanding of the importance of proper follow up care.   This document serves as a record of services personally performed by Karie Chimera, MD, PhD. It was created on their behalf by Laurian Brim, OA, an ophthalmic assistant. The creation of this record is the provider's dictation and/or activities during the visit.    Electronically signed by: Laurian Brim, OA  06.01.2020 1:24 PM .  Karie Chimera, M.D., Ph.D. Diseases & Surgery of the Retina and Vitreous [facility]  I have reviewed the above documentation for accuracy and completeness, and I agree with the above. Karie Chimera, M.D., Ph.D. 11/10/18 1:24 PM    Abbreviations: M myopia (nearsighted); A astigmatism; H hyperopia (farsighted); P presbyopia; Mrx spectacle prescription;  CTL contact lenses; OD right eye; OS left eye; OU both eyes  XT exotropia; ET esotropia; PEK punctate epithelial keratitis; PEE punctate epithelial erosions; DES dry eye syndrome; MGD meibomian gland dysfunction; ATs artificial tears; PFAT's preservative free artificial tears; NSC nuclear sclerotic cataract; PSC posterior subcapsular cataract; ERM epi-retinal membrane; PVD posterior vitreous detachment; RD retinal detachment; DM diabetes mellitus; DR diabetic retinopathy; NPDR non-proliferative diabetic retinopathy; PDR proliferative diabetic retinopathy; CSME clinically significant macular edema; DME diabetic macular edema; dbh dot blot hemorrhages; CWS cotton wool spot; POAG primary open angle glaucoma; C/D cup-to-disc ratio; HVF humphrey visual field; GVF goldmann visual field; OCT optical coherence tomography; IOP intraocular pressure; BRVO Branch retinal vein  occlusion; CRVO central retinal vein occlusion; CRAO central retinal artery occlusion; BRAO branch retinal artery occlusion; RT retinal tear; SB scleral buckle; PPV pars plana vitrectomy; VH Vitreous hemorrhage; PRP panretinal laser photocoagulation; IVK intravitreal kenalog; VMT vitreomacular traction; MH Macular hole;  NVD neovascularization of the disc; NVE neovascularization elsewhere; AREDS age related eye disease study; ARMD age related macular degeneration; POAG primary open angle glaucoma; EBMD epithelial/anterior basement membrane dystrophy; ACIOL anterior chamber intraocular lens; IOL intraocular lens; PCIOL posterior chamber intraocular lens; Phaco/IOL phacoemulsification with intraocular lens placement; PRK photorefractive keratectomy; LASIK laser assisted in situ keratomileusis; HTN hypertension; DM diabetes mellitus; COPD chronic obstructive pulmonary disease

## 2020-12-26 ENCOUNTER — Ambulatory Visit (HOSPITAL_COMMUNITY)
Admission: EM | Admit: 2020-12-26 | Discharge: 2020-12-26 | Disposition: A | Discharge status: Home / Self Care | Payer: Self-pay | Attending: Physician Assistant | Admitting: Physician Assistant

## 2020-12-26 ENCOUNTER — Encounter (HOSPITAL_COMMUNITY): Payer: Self-pay

## 2020-12-26 ENCOUNTER — Other Ambulatory Visit: Payer: Self-pay

## 2020-12-26 DIAGNOSIS — M62838 Other muscle spasm: Secondary | ICD-10-CM

## 2020-12-26 MED ORDER — CYCLOBENZAPRINE HCL 5 MG PO TABS: 5.0000 mg | ORAL_TABLET | Freq: Three times a day (TID) | ORAL | 0 refills | Status: DC | PRN

## 2020-12-26 NOTE — Discharge Instructions (Addendum)
Take Flexeril as needed for muscle spasm Apply ice to affected area Recommend stretching  Please use Mental Health resources given  Contact children's pediatrician for further recommendations.
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
## 2020-12-26 NOTE — ED Triage Notes (Signed)
Pt reports neck pain x 2 days. Ibuprofen gives relief.

## 2023-08-27 ENCOUNTER — Encounter: Payer: Self-pay | Admitting: Internal Medicine

## 2023-08-27 ENCOUNTER — Ambulatory Visit: Payer: Self-pay | Admitting: Internal Medicine

## 2023-08-27 VITALS — BP 138/88 | HR 97 | Resp 16 | Ht 63.5 in | Wt 170.0 lb

## 2023-08-27 DIAGNOSIS — R10819 Abdominal tenderness, unspecified site: Secondary | ICD-10-CM

## 2023-08-27 DIAGNOSIS — Z862 Personal history of diseases of the blood and blood-forming organs and certain disorders involving the immune mechanism: Secondary | ICD-10-CM

## 2023-08-27 DIAGNOSIS — Z9189 Other specified personal risk factors, not elsewhere classified: Secondary | ICD-10-CM

## 2023-08-27 DIAGNOSIS — Z7689 Persons encountering health services in other specified circumstances: Secondary | ICD-10-CM

## 2023-08-27 DIAGNOSIS — Z8619 Personal history of other infectious and parasitic diseases: Secondary | ICD-10-CM | POA: Insufficient documentation

## 2023-08-27 DIAGNOSIS — N898 Other specified noninflammatory disorders of vagina: Secondary | ICD-10-CM

## 2023-08-27 DIAGNOSIS — M79622 Pain in left upper arm: Secondary | ICD-10-CM

## 2023-08-27 DIAGNOSIS — H52203 Unspecified astigmatism, bilateral: Secondary | ICD-10-CM

## 2023-08-27 DIAGNOSIS — H3562 Retinal hemorrhage, left eye: Secondary | ICD-10-CM

## 2023-08-27 DIAGNOSIS — H52209 Unspecified astigmatism, unspecified eye: Secondary | ICD-10-CM | POA: Insufficient documentation

## 2023-08-27 DIAGNOSIS — H5213 Myopia, bilateral: Secondary | ICD-10-CM | POA: Insufficient documentation

## 2023-08-27 DIAGNOSIS — J302 Other seasonal allergic rhinitis: Secondary | ICD-10-CM | POA: Insufficient documentation

## 2023-08-27 LAB — POCT URINALYSIS DIPSTICK
Bilirubin, UA: NEGATIVE
Glucose, UA: NEGATIVE
Ketones, UA: NEGATIVE
Leukocytes, UA: NEGATIVE
Nitrite, UA: NEGATIVE
Protein, UA: NEGATIVE
Spec Grav, UA: 1.02 (ref 1.010–1.025)
Urobilinogen, UA: 0.2 U/dL
pH, UA: 5 (ref 5.0–8.0)

## 2023-08-27 LAB — POCT WET PREP WITH KOH
Clue Cells Wet Prep HPF POC: NEGATIVE
KOH Prep POC: NEGATIVE
RBC Wet Prep HPF POC: NEGATIVE
Trichomonas, UA: NEGATIVE
Yeast Wet Prep HPF POC: NEGATIVE

## 2023-08-27 MED ORDER — IBUPROFEN 200 MG PO TABS: ORAL_TABLET | ORAL | Status: DC

## 2023-08-27 NOTE — Progress Notes (Signed)
 Subjective:    Patient ID: Whitney [name], female   DOB: 10/10/1985, 38 y.o.   MRN: [id number]   HPI  Whitney [name] interprets  Here to establish   Possible UTI vs vaginitis:  Had testing done at [facility], but she never heard back regarding bloodwork or if culture.  Sounds like also had STI evaluation as well.  No treatment given at the time.  She is having very minor vaginal itching.  No definite vaginal discharge.  No fever and nor dysuria.    2.  History of left subretinal bleed in 2020.  Treated at [facility] with Avastin IO injections.  Has residual floater or dark spot in vision.    3.  Left upper pain:  from shoulder to elbow on lateral aspect.  Describes an intermittent heaviness.  Over past 2 weeks, has had on 3 separate occasions.  Can last 10-15 minutes.  Does not stop what she is doing, just gradually resolves without change in activity.   Last episode was sometime last week.  Can say she was walking with the last episode and was not carrying anything--just swinging her arms.   Generally occurs when she is in a hurry, when she is stressed.   She does not recall any acute injury or overuse injury prior to the discomfort starting.    4.  States diagnosed with Hepatitis at age 66 years of age.  She thought it was Hep B, but was food borne, so has been told it was more likely Hepatitis A.  She has only be tested for Hep BSAg.      5.  HM:  Has not had influenza vaccine this past season.  Despite deferral documentation for Tdap in 2018, patient states she did receive then.   Immunization History  Administered Date(s) Administered   Influenza,inj,Quad PF,6+ Mos 06/25/2016   Novel Infuenza-h1n1-09 05/15/2008   PFIZER(Purple Top)SARS-COV-2 Vaccination 08/30/2019, 09/20/2019     Current Meds  Medication Sig   acetaminophen (TYLENOL) 325 MG tablet Take 2 tablets (650 mg total) by mouth every 4 (four) hours as needed (for pain scale < 4).   fexofenadine (ALLEGRA) 30 MG tablet Take 30  mg by mouth as needed.   fluticasone (FLONASE) 50 MCG/ACT nasal spray Place 2 sprays into both nostrils at bedtime.   ibuprofen (ADVIL,MOTRIN) 600 MG tablet Take 1 tablet (600 mg total) by mouth every 6 (six) hours.   MAGNESIUM PO Take 2 tablets by mouth daily.   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 [facility]:  treated with intraocular injections of Avastin   Past Surgical History:  Procedure Laterality Date   CESAREAN SECTION  2009   CESAREAN SECTION N/A 01/30/2017   Procedure: REPEAT CESAREAN SECTION;  Surgeon: Catalina Antigua, MD;  [facility];  Service: Obstetrics;  Laterality: N/A;   Family History  Problem Relation Age of Onset   Diabetes Mother    Hypertension Father    Asthma Son    Kidney disease Son        Solitary kidney   Diabetes Maternal Grandmother    Diabetes Maternal Grandfather    Hypertension Paternal Grandmother    Hypertension Paternal Grandfather    Family Status  Relation Name Status   Mother  Whitney [name], age 42y  Father  Alive, age 72y   Sister  Alive, age 32y   Brother  Alive, age 36y   Daughter Building control surveyor, age 12y   Son Whitney [name], age 78y   Son Whitney [name], age 6y   MGM  (Not Specified)   MGF  (Not Specified)   PGM  (Not Specified)   PGF  (Not Specified)  No partnership data on file   Social History   Socioeconomic History   Marital status: Married    Spouse name: Whitney [name]   Number of children: 3   Years of education: 12   Highest education level: High school graduate  Occupational History   Occupation: Orthoptist.  Tobacco Use   Smoking status: Never    Passive exposure: Never   Smokeless tobacco: Never  Vaping Use   Vaping status: Never Used  Substance and Sexual Activity   Alcohol use: Yes    Comment: Rare wine   Drug use: No    Sexual activity: Yes    Birth control/protection: I.U.D.    Comment: IUD expires in 2028.  Placed at [facility]  Other Topics Concern   Not on file  Social History Narrative   Lives at home with husband and their 3 children.   Social Drivers of Corporate investment banker Strain: Not on file  Food Insecurity: Not on file  Transportation Needs: Not on file  Physical Activity: Not on file  Stress: Not on file  Social Connections: Not on file  Intimate Partner Violence: Not on file        Review of Systems    Objective:   BP 138/88 (BP Location: Left Arm, Patient Position: Sitting, Cuff Size: Normal)   Pulse 97   Resp 16   Ht 5' 3.5" (1.613 m)   Wt 170 lb (77.1 kg)   LMP 08/05/2023 (Approximate)   BMI 29.64 kg/m   Physical Exam HENT:     Head: Normocephalic and atraumatic.     Right Ear: Tympanic membrane, ear canal and external ear normal.     Left Ear: Tympanic membrane, ear canal and external ear normal.     Nose: Nose normal.     Mouth/Throat:     Mouth: Mucous membranes are moist.     Pharynx: Oropharynx is clear.     Comments: Missing teeth Eyes:     Extraocular Movements: Extraocular movements intact.     Conjunctiva/sclera: Conjunctivae normal.     Pupils: Pupils are equal, round, and reactive to light.  Cardiovascular:     Rate and Rhythm: Normal rate and regular rhythm.     Heart sounds: No murmur heard.    No friction rub.  Pulmonary:     Effort: Pulmonary effort is normal.     Breath sounds: Normal breath sounds.  Abdominal:     General: Abdomen is flat. Bowel sounds are normal.     Palpations: Abdomen is soft. There is no mass.     Tenderness: There is no abdominal tenderness.     Hernia: No hernia is present.  Musculoskeletal:        General: Normal range of motion.     Cervical back: Normal range of motion and neck supple.     Comments: Full ROM of left shoulder NT over cervical spinous processes Tender over trap and tight to  palpation. Mild tenderness over subacromial space NT over AC and CC joints as well as clavicle NT over epicondyles at elbow, both medial and lateral  Lymphadenopathy:  Head:     Right side of head: No submental or submandibular adenopathy.     Left side of head: No submental or submandibular adenopathy.     Cervical: No cervical adenopathy.     Upper Body:     Right upper body: No supraclavicular adenopathy.     Left upper body: No supraclavicular adenopathy.  Skin:    General: Skin is warm.     Capillary Refill: Capillary refill takes less than 2 seconds.  Neurological:     General: No focal deficit present.     Mental Status: She is alert and oriented to person, place, and time.     Cranial Nerves: Cranial nerves 2-12 are intact.     Motor: Motor function is intact.     Coordination: Coordination is intact.     Deep Tendon Reflexes: Reflexes are normal and symmetric.      Assessment & Plan   History of visit with [facility]:  was told they would call if something turned positive.  She was told not likely UTI and more likely vaginal issue.  Release of info for those records.  UA with trace blood.  Wet prep  2.  Left upper arm/shoulder discomfort:  mild muscle spasm.  Ibuprofen 600 mg twice daily for 2 weeks.  If is not resolving over that time period, will send to PT.  3.  History of hepatitis:  serology for Hep A and other B than Ag.  Hep C Ab.  Vaccinate as needed.  4.  History of anemia:  CBC  5.  Establish care:  screening with CMP and FLP.  Referral to dental clinic--need for dental care

## 2023-08-28 LAB — LIPID PANEL W/O CHOL/HDL RATIO
Cholesterol, Total: 248 mg/dL — ABNORMAL HIGH (ref 100–199)
HDL: 67 mg/dL (ref >39)
LDL Chol Calc (NIH): 164 mg/dL — ABNORMAL HIGH (ref 0–99)
Triglycerides: 97 mg/dL (ref 0–149)
VLDL Cholesterol Cal: 17 mg/dL (ref 5–40)

## 2023-08-28 LAB — COMPREHENSIVE METABOLIC PANEL
ALT: 17 IU/L (ref 0–32)
AST: 30 IU/L (ref 0–40)
Albumin: 4.2 g/dL (ref 3.9–4.9)
Alkaline Phosphatase: 129 IU/L — ABNORMAL HIGH (ref 44–121)
BUN/Creatinine Ratio: 17 (ref 9–23)
BUN: 9 mg/dL (ref 6–20)
Bilirubin Total: 0.2 mg/dL (ref 0.0–1.2)
CO2: 20 mmol/L (ref 20–29)
Calcium: 9.2 mg/dL (ref 8.7–10.2)
Chloride: 102 mmol/L (ref 96–106)
Creatinine, Ser: 0.53 mg/dL — ABNORMAL LOW (ref 0.57–1.00)
Globulin, Total: 2.7 g/dL (ref 1.5–4.5)
Glucose: 80 mg/dL (ref 70–99)
Potassium: 4.5 mmol/L (ref 3.5–5.2)
Sodium: 140 mmol/L (ref 134–144)
Total Protein: 6.9 g/dL (ref 6.0–8.5)
eGFR: 123 mL/min/{1.73_m2} (ref >59)

## 2023-08-28 LAB — CBC WITH DIFFERENTIAL/PLATELET
Basophils Absolute: 0 10*3/uL (ref 0.0–0.2)
Basos: 0 %
EOS (ABSOLUTE): 0.1 10*3/uL (ref 0.0–0.4)
Eos: 1 %
Hematocrit: 40.1 % (ref 34.0–46.6)
Hemoglobin: 13.1 g/dL (ref 11.1–15.9)
Immature Grans (Abs): 0 10*3/uL (ref 0.0–0.1)
Immature Granulocytes: 0 %
Lymphocytes Absolute: 2.4 10*3/uL (ref 0.7–3.1)
Lymphs: 32 %
MCH: 27.5 pg (ref 26.6–33.0)
MCHC: 32.7 g/dL (ref 31.5–35.7)
MCV: 84 fL (ref 79–97)
Monocytes Absolute: 0.4 10*3/uL (ref 0.1–0.9)
Monocytes: 5 %
Neutrophils Absolute: 4.8 10*3/uL (ref 1.4–7.0)
Neutrophils: 62 %
Platelets: 344 10*3/uL (ref 150–450)
RBC: 4.77 x10E6/uL (ref 3.77–5.28)
RDW: 13.5 % (ref 11.7–15.4)
WBC: 7.7 10*3/uL (ref 3.4–10.8)

## 2023-08-28 LAB — HEPATITIS A ANTIBODY, TOTAL: hep A Total Ab: POSITIVE — AB

## 2023-08-28 LAB — HEPATITIS C ANTIBODY: Hep C Virus Ab: NONREACTIVE

## 2023-08-28 LAB — HEPATITIS B SURFACE ANTIBODY,QUALITATIVE: Hep B Surface Ab, Qual: REACTIVE

## 2023-08-28 LAB — HEPATITIS B CORE ANTIBODY, TOTAL: Hep B Core Total Ab: NEGATIVE

## 2023-10-02 ENCOUNTER — Ambulatory Visit: Payer: Self-pay

## 2023-10-02 ENCOUNTER — Telehealth: Payer: Self-pay | Admitting: Internal Medicine

## 2023-10-02 DIAGNOSIS — Z23 Encounter for immunization: Secondary | ICD-10-CM

## 2023-10-02 DIAGNOSIS — H547 Unspecified visual loss: Secondary | ICD-10-CM

## 2023-10-02 NOTE — Telephone Encounter (Signed)
 Patient needs optometry referral as she wears contacts/glasses and states that she is due for a check up.

## 2023-10-16 NOTE — Telephone Encounter (Signed)
 Spoke with patient and referred her to [facility]

## 2023-10-23 ENCOUNTER — Telehealth: Payer: Self-pay

## 2023-10-23 NOTE — Telephone Encounter (Signed)
 Would like referral to optometry due to needing new contacts/glasses

## 2023-10-29 NOTE — Addendum Note (Signed)
 Addended by: Trudi Fus on: 10/29/2023 05:30 PM   Modules accepted: Orders

## 2023-10-29 NOTE — Telephone Encounter (Signed)
 Please do not duplicate phone notes

## 2023-10-30 NOTE — Telephone Encounter (Signed)
 Patient stated she has not been to [facility] for optometry.  Patient has been notified that optometry referral has been created and will be sent through [facility] at the beginning of June.

## 2024-01-09 ENCOUNTER — Encounter: Payer: Self-pay | Admitting: Internal Medicine

## 2024-01-09 ENCOUNTER — Ambulatory Visit: Payer: Self-pay | Admitting: Internal Medicine

## 2024-01-09 VITALS — BP 126/76 | HR 72 | Ht 63.0 in | Wt 172.0 lb

## 2024-01-09 DIAGNOSIS — Z124 Encounter for screening for malignant neoplasm of cervix: Secondary | ICD-10-CM

## 2024-01-09 DIAGNOSIS — E7800 Pure hypercholesterolemia, unspecified: Secondary | ICD-10-CM

## 2024-01-09 DIAGNOSIS — E66811 Obesity, class 1: Secondary | ICD-10-CM

## 2024-01-09 DIAGNOSIS — Z23 Encounter for immunization: Secondary | ICD-10-CM

## 2024-01-09 DIAGNOSIS — L918 Other hypertrophic disorders of the skin: Secondary | ICD-10-CM

## 2024-01-09 DIAGNOSIS — Z0000 Encounter for general adult medical examination without abnormal findings: Secondary | ICD-10-CM

## 2024-01-09 NOTE — Progress Notes (Signed)
 Subjective:    Patient ID: Whitney [name], female   DOB: 29-Jul-1985, 38 y.o.   MRN: [id number]   HPI  CPE with pap  1.  Pap:  Last about 3 years ago at [facility].  Always normal.    2.  Mammogram:  Never.  No family history of breast cancer.   3.  Osteoprevention:  Eats yogurt daily.  Occasionally,  cheese.  No milk.  She is not physically active and not outside regularly.    4.  Guaiac Cards/FIT:  Never.    5.  Colonoscopy:  Never.  No family history of colon cancer  6.  Immunizations:  Hep B serology misread previously--she has SAb positivity.  No need for further vaccination. Immunization History  Administered Date(s) Administered   Hepatitis B, ADULT 10/02/2023   Influenza,inj,Quad PF,6+ Mos 06/25/2016   Novel Infuenza-h1n1-09 05/15/2008   PFIZER(Purple Top)SARS-COV-2 Vaccination 08/30/2019, 09/20/2019     7.  Glucose/Cholesterol:  Glucose fine.  Cholesterol in March was high.  She states she has made lifestyle changes to get cholesterol down.   Lipid Panel     Component Value Date/Time   CHOL 248 (H) 08/27/2023 1108   TRIG 97 08/27/2023 1108   HDL 67 08/27/2023 1108   LDLCALC 164 (H) 08/27/2023 1108   LABVLDL 17 08/27/2023 1108     Current Meds  Medication Sig   paragard intrauterine copper IUD IUD 1 each by Intrauterine route once. Expires in 2028.  Placed at [facility].   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 [facility]:  treated with intraocular injections of Avastin    Past Surgical History:  Procedure Laterality Date   CESAREAN SECTION  2009   CESAREAN SECTION N/A 01/30/2017   Procedure: REPEAT CESAREAN SECTION;  Surgeon: Alger Gong, MD;  Location: [facility];  Service: Obstetrics;  Laterality: N/A;   Family History  Problem Relation Age of Onset   Diabetes Mother     Hypertension Father    Asthma Son        childhood hx, no longer with symptoms   Kidney disease Son        Solitary kidney   Diabetes Maternal Grandmother    Diabetes Maternal Grandfather    Hypertension Paternal Grandmother    Hypertension Paternal Grandfather    Social History   Socioeconomic History   Marital status: Married    Spouse name: Jose   Number of children: 3   Years of education: 12   Highest education level: High school graduate  Occupational History   Occupation: Housewife.  Tobacco Use   Smoking status: Never    Passive exposure: Never   Smokeless tobacco: Never  Vaping Use   Vaping status: Never Used  Substance and Sexual Activity   Alcohol use: Yes    Comment: Rare wine   Drug use: No   Sexual activity: Yes    Birth control/protection: I.U.D.    Comment: IUD expires in 2028.  Placed at [facility]  Other Topics Concern   Not on file  Social History Narrative   Lives at home with husband and their 3 children.   Social Drivers of Corporate investment banker Strain: Low Risk  (01/09/2024)   Overall Financial Resource Strain (CARDIA)    Difficulty of Paying Living  Expenses: Not very hard  Food Insecurity: Not on file  Transportation Needs: No Transportation Needs (01/09/2024)   PRAPARE - Administrator, Civil Service (Medical): No    Lack of Transportation (Non-Medical): No  Physical Activity: Not on file  Stress: Not on file  Social Connections: Not on file  Intimate Partner Violence: Not At Risk (01/09/2024)   Humiliation, Afraid, Rape, and Kick questionnaire    Fear of Current or Ex-Partner: No    Emotionally Abused: No    Physically Abused: No    Sexually Abused: No      Review of Systems  HENT:  Negative for dental problem (Dental clinic cancelled appt.  She cannot recall if they gave her another date.).   Eyes:  Positive for visual disturbance (Has appt for Aug 8 with opometry.).      Objective:   BP 126/76 (BP Location:  Left Arm, Patient Position: Sitting, Cuff Size: Normal)   Pulse 72   Ht 5' 3 (1.6 m)   Wt 172 lb (78 kg)   LMP 12/26/2023 (Approximate)   BMI 30.47 kg/m   Physical Exam Constitutional:      Appearance: She is obese.  HENT:     Head: Normocephalic and atraumatic.     Right Ear: Tympanic membrane, ear canal and external ear normal.     Left Ear: Tympanic membrane, ear canal and external ear normal.     Nose: Nose normal.     Mouth/Throat:     Mouth: Mucous membranes are moist.     Pharynx: Oropharynx is clear.  Eyes:     Extraocular Movements: Extraocular movements intact.     Conjunctiva/sclera: Conjunctivae normal.     Pupils: Pupils are equal, round, and reactive to light.     Comments: Discs sharp  Neck:     Thyroid: No thyroid mass or thyromegaly.  Cardiovascular:     Rate and Rhythm: Normal rate and regular rhythm.     Heart sounds: S1 normal and S2 normal. No murmur heard.    No friction rub. No S3 or S4 sounds.     Comments: Carotid, radial, femoral, DP and PT pulses normal and equal.   Pulmonary:     Effort: Pulmonary effort is normal.     Breath sounds: Normal breath sounds and air entry.  Chest:  Breasts:    Right: No bleeding, inverted nipple or mass.     Left: No bleeding, inverted nipple or mass.       Comments: Skin tag consisting of areola tissue type attached to underside of left nipple just below and appearing not to involve opening of milk ducts. Abdominal:     General: Bowel sounds are normal.     Palpations: Abdomen is soft. There is no hepatomegaly, splenomegaly or mass.     Tenderness: There is no abdominal tenderness.     Hernia: No hernia is present.  Genitourinary:    Comments: Normal externa female genitalia No cervical lesion. No uterine or adnexal mass or tenderness. Musculoskeletal:        General: Normal range of motion.     Cervical back: Normal range of motion and neck supple.     Right lower leg: No edema.     Left lower leg: No  edema.  Lymphadenopathy:     Head:     Right side of head: No submental or submandibular adenopathy.     Left side of head: No submental or submandibular adenopathy.  Cervical: No cervical adenopathy.     Upper Body:     Right upper body: No supraclavicular or axillary adenopathy.     Left upper body: No supraclavicular or axillary adenopathy.     Lower Body: No right inguinal adenopathy. No left inguinal adenopathy.  Skin:    General: Skin is warm.     Capillary Refill: Capillary refill takes less than 2 seconds.     Findings: No rash.  Neurological:     General: No focal deficit present.     Mental Status: She is alert and oriented to person, place, and time.     Cranial Nerves: Cranial nerves 2-12 are intact.     Sensory: Sensation is intact.     Motor: Motor function is intact.     Coordination: Coordination is intact.     Gait: Gait is intact.     Deep Tendon Reflexes: Reflexes are normal and symmetric.  Psychiatric:        Mood and Affect: Mood normal.        Speech: Speech normal.        Behavior: Behavior normal. Behavior is cooperative.      Assessment & Plan   CPE with pap Cancel Hep B vaccination as her Hep B Sab supports immunity. Encouraged influenza and COVID vaccines in fall.  2.  Left nipple skin tag:  will see if can get into derm with [facility] for removal.    3.  Hypercholesterolemia:  encouraged work on lifestyle changes with weight loss as well.  4.  Vision:  as per optometry  5.  Need for Dental Care:  to call if unable to get through to dental clinic.

## 2024-01-10 ENCOUNTER — Other Ambulatory Visit: Payer: Self-pay | Admitting: Internal Medicine

## 2024-01-14 LAB — CYTOLOGY - PAP

## 2024-01-24 ENCOUNTER — Other Ambulatory Visit: Payer: Self-pay

## 2024-01-27 ENCOUNTER — Ambulatory Visit: Payer: Self-pay | Admitting: Internal Medicine

## 2024-01-27 DIAGNOSIS — E66811 Obesity, class 1: Secondary | ICD-10-CM | POA: Insufficient documentation

## 2024-02-14 ENCOUNTER — Other Ambulatory Visit: Payer: Self-pay

## 2024-02-14 DIAGNOSIS — Z23 Encounter for immunization: Secondary | ICD-10-CM

## 2024-02-14 DIAGNOSIS — E7800 Pure hypercholesterolemia, unspecified: Secondary | ICD-10-CM

## 2024-02-15 LAB — LIPID PANEL
Chol/HDL Ratio: 3.6 ratio (ref 0.0–4.4)
Cholesterol, Total: 237 mg/dL — ABNORMAL HIGH (ref 100–199)
HDL: 66 mg/dL (ref >39)
LDL Chol Calc (NIH): 152 mg/dL — ABNORMAL HIGH (ref 0–99)
Triglycerides: 111 mg/dL (ref 0–149)
VLDL Cholesterol Cal: 19 mg/dL (ref 5–40)

## 2024-02-18 ENCOUNTER — Telehealth: Payer: Self-pay | Admitting: Internal Medicine

## 2024-02-18 NOTE — Telephone Encounter (Signed)
 Patient called today and states that on her last visit she showed Doctor Adella a skin tag that she has besides her nipple.   Patient states Doctor told her that she was going to see if she was able to remove skin tag in office or if she needed to go to another place for procedure.   Patient would like to know if she can schedule with us  or what can she do.

## 2024-02-26 NOTE — Telephone Encounter (Signed)
 Whitney [name] from [facility] the referral for patient to be seen by dermatologist for possible removal of skin tag.   Holly emailed me back and states that she sent referral to [facility],   Thayer states she received the response back from [place] derm, and they are needing a photo of the lesion to see if they'd be able to schedule. She also states that she was told by [place] derm that there is still a possibility that patient wouldn't be scheduled, depending on the lesion.

## 2024-03-13 ENCOUNTER — Telehealth: Payer: Self-pay | Admitting: Internal Medicine

## 2024-03-13 NOTE — Telephone Encounter (Signed)
 Called Epic Help line to find out how to do with limited success--question is into Krista Kolphenson.

## 2024-03-19 NOTE — Telephone Encounter (Signed)
 Patient stopped by today and states she is okay to go to the specialists office to get skin tag checked even if they tell her that they will not be able to remove it.

## 2024-04-11 ENCOUNTER — Ambulatory Visit: Payer: Self-pay | Admitting: Internal Medicine

## 2024-06-22 ENCOUNTER — Telehealth: Payer: Self-pay | Admitting: Internal Medicine

## 2024-06-22 NOTE — Telephone Encounter (Signed)
 Patient would like an appointment for breast pain  Patient states she has been having pain on her left breast for past three months.  Patient states she feels pain and burning,  Pain runs from nipple down to armpit ,  Patient states at her last cpe appointment she show the doctor a skin tag that she has besides her nipple on same breast.  patient states there is times where she touches her skin tag to fix her bra and she feels the same pain running down arm pit. Patient believes pain must be coming from the skin tag.   We will call patient for an appointment when there is a cancellation.

## 2024-07-15 ENCOUNTER — Ambulatory Visit: Payer: Self-pay | Admitting: Internal Medicine

## 2024-07-15 VITALS — BP 110/80 | HR 68 | Resp 12 | Ht 63.0 in | Wt 185.0 lb

## 2024-07-15 DIAGNOSIS — S29011A Strain of muscle and tendon of front wall of thorax, initial encounter: Secondary | ICD-10-CM

## 2024-07-15 DIAGNOSIS — L918 Other hypertrophic disorders of the skin: Secondary | ICD-10-CM

## 2024-07-15 MED ORDER — IBUPROFEN 200 MG PO TABS: ORAL_TABLET | ORAL | Status: AC

## 2024-07-15 NOTE — Progress Notes (Unsigned)
 "  Acute Office Visit  Subjective:  Estefania Alfaro interpreted for Dr. Adella. The interpretation service slow right now and not working very well. Pt said she can try to communicate with English to me. Her English was adequate enough to obtain a history and physical for me. I, MD in training under Dr. Adella, did initial H/P then presented to Dr. Adella who did her own evaluation, orders, pt education, discharge instructions. This is a record of what I observed during my and Dr. Felix evaluations.   Patient ID: Whitney [name], female    DOB: 1986-04-24, 39 y.o.   MRN: [id number]  HPI The area next to the lateral border of the left breast and under the left outer edge of the left breast have a burning sensation.  Started 3 months ago. Does not recall what she was doing when she noticed it. She says she was going about her usual activities and noticed it.   Did not do anything about it. Occurs once or 2 times a week and stays there all day. It has not changed in quality and intensity over the past 3 months. Sometimes she feels she has itchiness and wants to scratch it but it is in both armpits. No rash.  No prior h/o this. No trauma. H/o left nipple skin tag per last clinic note and per pt. When I sleep on my belly, the skin tag gets pulled and I have the same pain. It does not radiate from the skin tag and is not a shooting pain. No other areas have this .  At night sleeps mostly on her left or right side, and in the morning sleeps on her belly. Around her periods , her breasts gets bigger and more tender and their is more burning and itching in the area. Wears sports bra and regular bras without underwire. No discharge from nipple. No breast feeding.  Has been using new deodarant for past 7 months.  No change with arm and neck movement. No lifting of kids. Not taken anything for it.   No exercise. Reports stress with kids.   Fhx- -No breast cancer. Mother with DM with onset at age  73.  SoHx- No tobacco, drugs, alcohol.  Stay-at-home mom  Allergies[1] (hover over dots) Active Medications[2] (hover over dots)   Review of Systems  Skin:  Negative for itching (intermittent B itching of both arm pits) and rash.  Endo/Heme/Allergies:        Last HgbA1C 07/30/23 and last fasting blood gluc normal on EPIC  Psychiatric/Behavioral:  The patient is not nervous/anxious (some worries regarding kids).       Objective:    BP 110/80 (BP Location: Right Arm, Patient Position: Sitting, Cuff Size: Normal)   Pulse 68   Resp 12   Ht 5' 3 (1.6 m)   Wt 185 lb (83.9 kg)   LMP 06/15/2024 (Approximate)   BMI 32.77 kg/m  Physical Exam Vitals reviewed.  Constitutional:      General: She is not in acute distress.    Appearance: Normal appearance. She is not ill-appearing, toxic-appearing or diaphoretic.  Pulmonary:     Effort: Pulmonary effort is normal.  Chest:       Comments: Left nipple- (red in the diagram above) has a 1/2 by 1/2 cm nipple color skin tag that is non-tender; palpation of the tag produces no burning in the area of concern ( the blue area in the diagram).  The blue area- +tenderness of the left pectoralis edge  by Dr. Adella. No lymph node enlargement. No fluctuance.  No masses.   No bruises.   No increase in the burning pain with shoulder movement or neck movement.  Musculoskeletal:       Arms:     Cervical back: Normal range of motion.  Neurological:     Mental Status: She is alert.  Psychiatric:        Mood and Affect: Mood normal.        Behavior: Behavior normal.        Judgment: Judgment normal.       Assessment & Plan:  1-Pectoralis Muscle Strain- Ibuprofen  PRN and pectoralis muscle stretching. 2-Acquired Skin Tag of Left Breast- Derm f/u once able to upload a picture to them. 3-Weight gain- FHx of DM- Advised to lose weight.  Nutrition education in 2 weeks with me.   RTC if worse. Otherwise f/u per scheduled visits.  Caron Kaiser,  MD      [1] No Known Allergies [2]  Current Meds  Medication Sig   acetaminophen  (TYLENOL ) 325 MG tablet Take 2 tablets (650 mg total) by mouth every 4 (four) hours as needed (for pain scale < 4).   fexofenadine (ALLEGRA) 30 MG tablet Take 30 mg by mouth as needed.   paragard intrauterine copper IUD IUD 1 each by Intrauterine route once. Expires in 2028.  Placed at [facility].   "

## 2024-07-15 NOTE — Progress Notes (Unsigned)
 Tender over lateral aspect of pectoralis muscle on left.  Photo of nipple tag

## 2024-07-24 ENCOUNTER — Ambulatory Visit: Payer: Self-pay | Admitting: Internal Medicine

## 2025-01-08 ENCOUNTER — Other Ambulatory Visit: Payer: Self-pay

## 2025-01-12 ENCOUNTER — Encounter: Payer: Self-pay | Admitting: Internal Medicine
